# Patient Record
Sex: Female | Born: 1985
Health system: Southern US, Community
[De-identification: ages and names within clinical notes are randomized; demographics above are authoritative.]

## PROBLEM LIST (undated history)

## (undated) DIAGNOSIS — R519 Headache, unspecified: Secondary | ICD-10-CM

## (undated) DIAGNOSIS — R51 Headache: Secondary | ICD-10-CM

## (undated) DIAGNOSIS — T7840XA Allergy, unspecified, initial encounter: Secondary | ICD-10-CM

## (undated) HISTORY — DX: Allergy, unspecified, initial encounter: T78.40XA

## (undated) HISTORY — PX: WISDOM TOOTH EXTRACTION: SHX21

## (undated) HISTORY — DX: Headache: R51

## (undated) HISTORY — DX: Headache, unspecified: R51.9

---

## 2005-11-17 HISTORY — PX: NASAL SINUS SURGERY: SHX719

## 2005-11-17 HISTORY — PX: TONSILLECTOMY AND ADENOIDECTOMY: SUR1326

## 2013-11-10 LAB — HM PAP SMEAR: HM Pap smear: NORMAL

## 2014-07-11 ENCOUNTER — Encounter: Payer: Self-pay | Admitting: Internal Medicine

## 2014-07-11 ENCOUNTER — Ambulatory Visit (INDEPENDENT_AMBULATORY_CARE_PROVIDER_SITE_OTHER): Payer: 59 | Admitting: Internal Medicine

## 2014-07-11 VITALS — BP 102/68 | HR 91 | Resp 12 | Ht 63.0 in | Wt 117.2 lb

## 2014-07-11 DIAGNOSIS — Z0000 Encounter for general adult medical examination without abnormal findings: Secondary | ICD-10-CM

## 2014-07-11 DIAGNOSIS — R590 Localized enlarged lymph nodes: Secondary | ICD-10-CM | POA: Insufficient documentation

## 2014-07-11 DIAGNOSIS — R599 Enlarged lymph nodes, unspecified: Secondary | ICD-10-CM

## 2014-07-11 DIAGNOSIS — L659 Nonscarring hair loss, unspecified: Secondary | ICD-10-CM | POA: Insufficient documentation

## 2014-07-11 DIAGNOSIS — Z23 Encounter for immunization: Secondary | ICD-10-CM

## 2014-07-11 DIAGNOSIS — J320 Chronic maxillary sinusitis: Secondary | ICD-10-CM | POA: Insufficient documentation

## 2014-07-11 DIAGNOSIS — N920 Excessive and frequent menstruation with regular cycle: Secondary | ICD-10-CM | POA: Insufficient documentation

## 2014-07-11 NOTE — Assessment & Plan Note (Signed)
Recent heavy, prolonged menses. Will check CBC and ferritin with labs today. Discussed restarting OCP, however she would like to get pregnant in the near future. Will set up OB/GYN evaluation

## 2014-07-11 NOTE — Assessment & Plan Note (Signed)
Given persistent symptoms and h/o sinus surgery in the past, will set up ENT evaluation for direct visualization and possible CT.

## 2014-07-11 NOTE — Assessment & Plan Note (Signed)
Likely related to recent issues with sinus infection and allergies. Will set up ENT evaluation. If lymphadenopathy persistent, then CT neck may be helpful.

## 2014-07-11 NOTE — Progress Notes (Signed)
Pre visit review using our clinic review tool, if applicable. No additional management support is needed unless otherwise documented below in the visit note. 

## 2014-07-11 NOTE — Patient Instructions (Signed)
Labs today.  Follow up in 4 weeks. 

## 2014-07-11 NOTE — Assessment & Plan Note (Signed)
Recent thinning of hair. Will check CBC, ferritin and TSH with labs.

## 2014-07-11 NOTE — Progress Notes (Signed)
Subjective:    Patient ID: Margaret Schwartz, female    DOB: 1986-10-03, 28 y.o.   MRN: [id number]  HPI 28YO female presents to establish care.  Allergies - left eye with some puffiness on occasion. Sneezing, runny nose. Allergy testing showed multiple outdoor and indoor allergens. Taking Zyrtec with no improvement. Also tried Claritin and Allegra with no improvement. Has occasional sores in nose. Chunks of green tissue with blowing nose. Pressure an pain over cheeks. Notes some enlarged lymph nodes in her neck, particularly on the right.   Notes some recent thinning of hair. This also occurred about 5 months after her pregnancy.  Heavy menses - up to 15 days long at times. Sometimes heavy with clotting. Soaking pads every 1-2 hr. Even in the middle of month, has spotting. On OCP in the past. Question of endometriosis in the past, but never tested.  Review of Systems  Constitutional: Negative for fever, chills, appetite change, fatigue and unexpected weight change.  HENT: Positive for congestion, postnasal drip, rhinorrhea, sinus pressure and sneezing. Negative for sore throat and tinnitus.   Eyes: Negative for visual disturbance.  Respiratory: Negative for shortness of breath.   Cardiovascular: Negative for chest pain and leg swelling.  Gastrointestinal: Negative for nausea, vomiting, abdominal pain, diarrhea and constipation.  Genitourinary: Positive for vaginal bleeding and menstrual problem. Negative for dysuria, frequency, flank pain and vaginal discharge.  Musculoskeletal: Negative for arthralgias and myalgias.  Skin: Negative for color change and rash.  Hematological: Negative for adenopathy. Does not bruise/bleed easily.  Psychiatric/Behavioral: Negative for dysphoric mood. The patient is not nervous/anxious.        Objective:    BP 102/68  Pulse 91  Resp 12  Ht 5\' 3"  (1.6 m)  Wt 117 lb 4 oz (53.184 kg)  BMI 20.78 kg/m2  SpO2 97% Physical Exam  Constitutional: She is  oriented to person, place, and time. She appears well-developed and well-nourished. No distress.  HENT:  Head: Normocephalic and atraumatic.  Right Ear: External ear normal.  Left Ear: External ear normal.  Nose: Nose normal.  Mouth/Throat: Oropharynx is clear and moist. No oropharyngeal exudate.  Eyes: Conjunctivae and EOM are normal. Pupils are equal, round, and reactive to light. Right eye exhibits no discharge.  Neck: Normal range of motion. Neck supple. No thyromegaly present.  Cardiovascular: Normal rate, regular rhythm, normal heart sounds and intact distal pulses.  Exam reveals no gallop and no friction rub.   No murmur heard. Pulmonary/Chest: Effort normal. No respiratory distress. She has no wheezes. She has no rales.  Abdominal: Soft. Bowel sounds are normal. She exhibits no distension and no mass. There is no tenderness. There is no rebound and no guarding.  Musculoskeletal: Normal range of motion. She exhibits no edema and no tenderness.  Lymphadenopathy:    She has cervical adenopathy (few shoddy cervical lymph nodes).       Right: Supraclavicular (small subcentimenter right supraclavicular node) adenopathy present.  Neurological: She is alert and oriented to person, place, and time. No cranial nerve deficit. Coordination normal.  Skin: Skin is warm and dry. No rash noted. She is not diaphoretic. No erythema. No pallor.  Psychiatric: She has a normal mood and affect. Her behavior is normal. Judgment and thought content normal.          Assessment & Plan:   Problem List Items Addressed This Visit     Unprioritized   Chronic maxillary sinusitis - Primary     Given persistent symptoms and  h/o sinus surgery in the past, will set up ENT evaluation for direct visualization and possible CT.     Relevant Medications      cetirizine (ZYRTEC) 10 MG tablet   Other Relevant Orders      Ambulatory referral to ENT      Comprehensive metabolic panel      Lipid panel      Vit D   25 hydroxy (rtn osteoporosis monitoring)   Hair loss     Recent thinning of hair. Will check CBC, ferritin and TSH with labs.    Relevant Orders      TSH   Menorrhagia with regular cycle     Recent heavy, prolonged menses. Will check CBC and ferritin with labs today. Discussed restarting OCP, however she would like to get pregnant in the near future. Will set up OB/GYN evaluation    Relevant Orders      Ambulatory referral to Gynecology      CBC with Differential      Ferritin    Other Visit Diagnoses   Routine general medical examination at a health care facility        Need for prophylactic vaccination and inoculation against influenza        Relevant Orders       Flu Vaccine QUAD 36+ mos PF IM (Fluarix Quad PF) (Completed)        Return in about 4 weeks (around 08/08/2014) for Recheck.

## 2014-07-12 ENCOUNTER — Encounter: Payer: Self-pay | Admitting: Internal Medicine

## 2014-07-12 LAB — COMPREHENSIVE METABOLIC PANEL
ALBUMIN: 4.2 g/dL (ref 3.5–5.2)
ALT: 16 U/L (ref 0–35)
AST: 16 U/L (ref 0–37)
Alkaline Phosphatase: 45 U/L (ref 39–117)
BILIRUBIN TOTAL: 0.4 mg/dL (ref 0.2–1.2)
BUN: 13 mg/dL (ref 6–23)
CO2: 26 mEq/L (ref 19–32)
Calcium: 8.8 mg/dL (ref 8.4–10.5)
Chloride: 104 mEq/L (ref 96–112)
Creatinine, Ser: 0.5 mg/dL (ref 0.4–1.2)
GFR: 140.48 mL/min (ref >60.00)
Glucose, Bld: 72 mg/dL (ref 70–99)
Potassium: 3.8 mEq/L (ref 3.5–5.1)
Sodium: 137 mEq/L (ref 135–145)
Total Protein: 7.3 g/dL (ref 6.0–8.3)

## 2014-07-12 LAB — CBC WITH DIFFERENTIAL/PLATELET
Basophils Absolute: 0 10*3/uL (ref 0.0–0.1)
Basophils Relative: 0.6 % (ref 0.0–3.0)
EOS PCT: 1.1 % (ref 0.0–5.0)
Eosinophils Absolute: 0.1 10*3/uL (ref 0.0–0.7)
HEMATOCRIT: 37.2 % (ref 36.0–46.0)
HEMOGLOBIN: 12.7 g/dL (ref 12.0–15.0)
LYMPHS ABS: 2 10*3/uL (ref 0.7–4.0)
Lymphocytes Relative: 28.6 % (ref 12.0–46.0)
MCHC: 34 g/dL (ref 30.0–36.0)
MCV: 88.4 fl (ref 78.0–100.0)
Monocytes Absolute: 0.4 10*3/uL (ref 0.1–1.0)
Monocytes Relative: 5.1 % (ref 3.0–12.0)
Neutro Abs: 4.4 10*3/uL (ref 1.4–7.7)
Neutrophils Relative %: 64.6 % (ref 43.0–77.0)
PLATELETS: 293 10*3/uL (ref 150.0–400.0)
RBC: 4.21 Mil/uL (ref 3.87–5.11)
RDW: 12 % (ref 11.5–15.5)
WBC: 6.9 10*3/uL (ref 4.0–10.5)

## 2014-07-12 LAB — LIPID PANEL
CHOLESTEROL: 145 mg/dL (ref 0–200)
HDL: 53.2 mg/dL (ref >39.00)
LDL Cholesterol: 80 mg/dL (ref 0–99)
NonHDL: 91.8
Total CHOL/HDL Ratio: 3
Triglycerides: 58 mg/dL (ref 0.0–149.0)
VLDL: 11.6 mg/dL (ref 0.0–40.0)

## 2014-07-12 LAB — FERRITIN: FERRITIN: 15.9 ng/mL (ref 10.0–291.0)

## 2014-07-12 LAB — VITAMIN D 25 HYDROXY (VIT D DEFICIENCY, FRACTURES): VITD: 24.57 ng/mL — AB (ref 30.00–100.00)

## 2014-07-12 LAB — TSH: TSH: 1.15 u[IU]/mL (ref 0.35–4.50)

## 2014-07-14 ENCOUNTER — Encounter: Payer: Self-pay | Admitting: Internal Medicine

## 2014-07-14 MED ORDER — PRENATAL PLUS IRON 29-1 MG PO TABS: 1.0000 | ORAL_TABLET | Freq: Every day | ORAL | Status: DC

## 2014-08-01 ENCOUNTER — Ambulatory Visit: Payer: Self-pay | Admitting: Otolaryngology

## 2014-08-16 LAB — HM PAP SMEAR

## 2014-09-26 ENCOUNTER — Other Ambulatory Visit: Payer: Self-pay | Admitting: Internal Medicine

## 2014-09-26 ENCOUNTER — Encounter: Payer: Self-pay | Admitting: Internal Medicine

## 2014-09-26 DIAGNOSIS — D689 Coagulation defect, unspecified: Secondary | ICD-10-CM

## 2014-09-28 ENCOUNTER — Telehealth: Payer: Self-pay | Admitting: *Deleted

## 2014-09-28 ENCOUNTER — Other Ambulatory Visit: Payer: Self-pay | Admitting: *Deleted

## 2014-09-28 DIAGNOSIS — D689 Coagulation defect, unspecified: Secondary | ICD-10-CM

## 2014-09-28 NOTE — Telephone Encounter (Signed)
Pt coming tomorrow what labs and dx? 

## 2014-09-28 NOTE — Telephone Encounter (Signed)
Homocysteine for coagulopathy

## 2014-09-29 ENCOUNTER — Other Ambulatory Visit (INDEPENDENT_AMBULATORY_CARE_PROVIDER_SITE_OTHER): Payer: 59

## 2014-09-29 DIAGNOSIS — D689 Coagulation defect, unspecified: Secondary | ICD-10-CM

## 2014-09-30 LAB — HOMOCYSTEINE: HOMOCYSTEINE: 4.4 umol/L (ref 4.0–15.4)

## 2014-10-28 ENCOUNTER — Telehealth: Payer: 59 | Admitting: Physician Assistant

## 2014-10-28 DIAGNOSIS — N3000 Acute cystitis without hematuria: Secondary | ICD-10-CM

## 2014-10-28 MED ORDER — FLUCONAZOLE 150 MG PO TABS: 150.0000 mg | ORAL_TABLET | Freq: Once | ORAL | Status: DC

## 2014-10-28 MED ORDER — SULFAMETHOXAZOLE-TRIMETHOPRIM 800-160 MG PO TABS: 1.0000 | ORAL_TABLET | Freq: Two times a day (BID) | ORAL | Status: DC

## 2014-10-28 NOTE — Progress Notes (Signed)
We are sorry that you are not feeling well.  Here is how we plan to help!  Based on what you shared with me it looks like you most likely have a simple urinary tract infection.  A UTI (Urinary Tract Infection) is a bacterial infection of the bladder.  Most cases of urinary tract infections are simple to treat but a key part of your care is to encourage you to drink plenty of fluids and watch your symptoms carefully.  I have prescribed Bactrim, a sulfa antibiotic, twice a day for 5 days.  Your symptoms should gradually improve. Call us if the burning in your urine worsens, you develop worsening fever, back pain or pelvic pain or if your symptoms do not resolve after completing the antibiotic.  Urinary tract infections can be prevented by drinking plenty of water to keep your body hydrated.  Also be sure when you wipe, wipe from front to back and don't hold it in!  If possible, empty your bladder every 4 hours.  Your e-visit answers were reviewed by a board certified advanced clinical practitioner to complete your personal care plan.  Depending on the condition, your plan could have included both over the counter or prescription medications.  Please review your pharmacy choice.  If there is a problem, you may call our nursing hot line at [phone] and have the prescription routed to another pharmacy.  Your safety is important to us.  If you have drug allergies check your prescription carefully.    You can use MyChart to ask questions about today's visit, request a non-urgent call back, or ask for a work or school excuse.  You will get an e-mail in the next two days asking about your experience.  I hope that your e-visit has been valuable and will speed your recovery. Thank you for using e-visits.    

## 2015-02-11 DIAGNOSIS — E7210 Disorders of sulfur-bearing amino-acid metabolism, unspecified: Secondary | ICD-10-CM | POA: Insufficient documentation

## 2015-07-13 ENCOUNTER — Encounter: Payer: Self-pay | Admitting: Internal Medicine

## 2015-07-15 ENCOUNTER — Telehealth: Payer: 59 | Admitting: Family

## 2015-07-15 DIAGNOSIS — J00 Acute nasopharyngitis [common cold]: Secondary | ICD-10-CM

## 2015-07-15 DIAGNOSIS — B9689 Other specified bacterial agents as the cause of diseases classified elsewhere: Secondary | ICD-10-CM

## 2015-07-15 DIAGNOSIS — J208 Acute bronchitis due to other specified organisms: Secondary | ICD-10-CM

## 2015-07-15 DIAGNOSIS — R05 Cough: Secondary | ICD-10-CM

## 2015-07-15 DIAGNOSIS — R059 Cough, unspecified: Secondary | ICD-10-CM

## 2015-07-15 MED ORDER — AZITHROMYCIN 250 MG PO TABS: ORAL_TABLET | ORAL | Status: DC

## 2015-07-15 MED ORDER — BENZONATATE 100 MG PO CAPS: 100.0000 mg | ORAL_CAPSULE | Freq: Three times a day (TID) | ORAL | Status: DC | PRN

## 2015-07-15 NOTE — Progress Notes (Signed)

## 2015-07-17 ENCOUNTER — Ambulatory Visit (INDEPENDENT_AMBULATORY_CARE_PROVIDER_SITE_OTHER)
Admission: RE | Admit: 2015-07-17 | Discharge: 2015-07-17 | Disposition: A | Discharge status: Home / Self Care | Payer: Self-pay | Source: Ambulatory Visit | Attending: Internal Medicine | Admitting: Internal Medicine

## 2015-07-17 ENCOUNTER — Ambulatory Visit (INDEPENDENT_AMBULATORY_CARE_PROVIDER_SITE_OTHER): Payer: 59 | Admitting: Internal Medicine

## 2015-07-17 ENCOUNTER — Encounter: Payer: Self-pay | Admitting: Internal Medicine

## 2015-07-17 VITALS — BP 114/76 | HR 86 | Temp 97.9°F | Ht 62.5 in | Wt 115.5 lb

## 2015-07-17 DIAGNOSIS — Z0000 Encounter for general adult medical examination without abnormal findings: Secondary | ICD-10-CM | POA: Insufficient documentation

## 2015-07-17 DIAGNOSIS — R059 Cough, unspecified: Secondary | ICD-10-CM

## 2015-07-17 DIAGNOSIS — R05 Cough: Secondary | ICD-10-CM | POA: Diagnosis not present

## 2015-07-17 NOTE — Assessment & Plan Note (Signed)
General medical exam normal today except as noted. She will get Flu vaccine at work. Fasting labs ordered. PAP is UTD and she has follow up with her OB. Encouraged healthy diet and exercise.

## 2015-07-17 NOTE — Progress Notes (Signed)
Subjective:    Patient ID: Margaret Schwartz, female    DOB: 1986-09-28, 29 y.o.   MRN: [id number]  HPI  29YO female presents for annual exam.  Over the last couple of weeks, she has developed congestion and cough. Her daughter had been sick with similar symptoms. Over last few days, she has developed right lower chest wall pain with crackles and "whistling" sound on inspiration. No recurrent fever or chills. No dyspnea. Seen in an E-visit and treated with azithromycin. No changes noted in symptoms. Continues to have mild congestion, non-productive cough and fatigue.  Aside from this, she is feeling well. PAP scheduled with OB this fall.   Past Medical History  Diagnosis Date  . Allergy   . Frequent headaches    Family History  Problem Relation Age of Onset  . Heart disease Mother   . Hyperlipidemia Mother   . Hypertension Mother   . Alcohol abuse Father   . Drug abuse Father   . Arthritis Maternal Grandmother   . Diabetes Maternal Grandmother   . Arthritis Maternal Grandfather   . Cancer Maternal Grandfather     Lung cancer  . Heart disease Maternal Grandfather   . Stroke Maternal Grandfather   . Hypertension Maternal Grandfather   . Cancer Maternal Uncle 48    NHL   Past Surgical History  Procedure Laterality Date  . Tonsillectomy and adenoidectomy  2007  . Nasal sinus surgery  2007    turbinate reduction  . Wisdom tooth extraction    . Vaginal delivery  2013    friable cervix with bleeding during pregnancy, anemia after pregnancy   Social History   Social History  . Marital Status: Married    Spouse Name: N/A  . Number of Children: N/A  . Years of Education: N/A   Social History Main Topics  . Smoking status: Never Smoker   . Smokeless tobacco: Never Used  . Alcohol Use: No  . Drug Use: No  . Sexual Activity: Not Asked   Other Topics Concern  . None   Social History Narrative   Lives in [place] with husband and daughter.      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 Negative for fever, chills, appetite change and unexpected weight change.  HENT: Positive for congestion, postnasal drip and rhinorrhea. Negative for sore throat, trouble swallowing and voice change.   Eyes: Negative for visual disturbance.  Respiratory: Positive for cough and wheezing. Negative for chest tightness and shortness of breath.   Cardiovascular: Negative for chest pain and leg swelling.  Gastrointestinal: Negative for nausea, vomiting, abdominal pain, diarrhea and constipation.  Musculoskeletal: Negative for myalgias and arthralgias.  Skin: Negative for color change and rash.  Hematological: Negative for adenopathy. Does not bruise/bleed easily.  Psychiatric/Behavioral: Negative for sleep disturbance and dysphoric mood. The patient is not nervous/anxious.        Objective:    BP 114/76 mmHg  Pulse 86  Temp(Src) 97.9 F (36.6 C) (Oral)  Ht 5' 2.5" (1.588 m)  Wt 115 lb 8 oz (52.39 kg)  BMI 20.78 kg/m2  SpO2 99%  LMP 06/27/2015 (Approximate) Physical Exam  Constitutional: She is oriented to person, place, and time. She appears well-developed and well-nourished. No distress.  HENT:  Head: Normocephalic and atraumatic.  Right Ear: External ear normal.  Left Ear:  External ear normal.  Nose: Nose normal.  Mouth/Throat: Oropharynx is clear and moist. No oropharyngeal exudate.  Eyes: Conjunctivae and EOM are normal. Pupils are equal, round, and reactive to light. Right eye exhibits no discharge. Left eye exhibits no discharge. No scleral icterus.  Neck: Normal range of motion. Neck supple. No tracheal deviation present. No thyromegaly present.  Cardiovascular: Normal rate, regular rhythm, normal heart sounds and intact distal pulses.  Exam reveals no gallop and no friction rub.   No murmur heard. Pulmonary/Chest: Effort normal. No accessory muscle usage.  No respiratory distress. She has no decreased breath sounds. She has no wheezes. She has rhonchi (few rhonchi right lower lung) in the right lower field. She has no rales. She exhibits no tenderness.  Abdominal: Soft. Bowel sounds are normal. She exhibits no distension and no mass. There is no tenderness. There is no rebound and no guarding.  Musculoskeletal: Normal range of motion. She exhibits no edema or tenderness.  Lymphadenopathy:    She has no cervical adenopathy.  Neurological: She is alert and oriented to person, place, and time. No cranial nerve deficit. She exhibits normal muscle tone. Coordination normal.  Skin: Skin is warm and dry. No rash noted. She is not diaphoretic. No erythema. No pallor.  Psychiatric: She has a normal mood and affect. Her behavior is normal. Judgment and thought content normal.          Assessment & Plan:   Problem List Items Addressed This Visit      Unprioritized   Cough    Recent cough likely secondary to viral URI, however exam remarkable for RLL crackles. Will get CXR for evaluation. Continue Azithromycin. Follow up if symptoms are not improving.      Relevant Orders   DG Chest 2 View   Routine general medical examination at a health care facility - Primary    General medical exam normal today except as noted. She will get Flu vaccine at work. Fasting labs ordered. PAP is UTD and she has follow up with her OB. Encouraged healthy diet and exercise.      Relevant Orders   TSH   CBC with Differential/Platelet   Comprehensive metabolic panel   Lipid panel   Vit D  25 hydroxy (rtn osteoporosis monitoring)       Return if symptoms worsen or fail to improve.

## 2015-07-17 NOTE — Progress Notes (Signed)
Pre visit review using our clinic review tool, if applicable. No additional management support is needed unless otherwise documented below in the visit note. 

## 2015-07-17 NOTE — Patient Instructions (Signed)
Chest xray today.  Health Maintenance Adopting a healthy lifestyle and getting preventive care can go a long way to promote health and wellness. Talk with your health care provider about what schedule of regular examinations is right for you. This is a good chance for you to check in with your provider about disease prevention and staying healthy. In between checkups, there are plenty of things you can do on your own. Experts have done a lot of research about which lifestyle changes and preventive measures are most likely to keep you healthy. Ask your health care provider for more information. WEIGHT AND DIET  Eat a healthy diet  Be sure to include plenty of vegetables, fruits, low-fat dairy products, and lean protein.  Do not eat a lot of foods high in solid fats, added sugars, or salt.  Get regular exercise. This is one of the most important things you can do for your health.  Most adults should exercise for at least 150 minutes each week. The exercise should increase your heart rate and make you sweat (moderate-intensity exercise).  Most adults should also do strengthening exercises at least twice a week. This is in addition to the moderate-intensity exercise.  Maintain a healthy weight  Body mass index (BMI) is a measurement that can be used to identify possible weight problems. It estimates body fat based on height and weight. Your health care provider can help determine your BMI and help you achieve or maintain a healthy weight.  For females 52 years of age and older:   A BMI below 18.5 is considered underweight.  A BMI of 18.5 to 24.9 is normal.  A BMI of 25 to 29.9 is considered overweight.  A BMI of 30 and above is considered obese.  Watch levels of cholesterol and blood lipids  You should start having your blood tested for lipids and cholesterol at 31 years of age, then have this test every 5 years.  You may need to have your cholesterol levels checked more often  if:  Your lipid or cholesterol levels are high.  You are older than 31 years of age.  You are at high risk for heart disease.  CANCER SCREENING   Lung Cancer  Lung cancer screening is recommended for adults 66-19 years old who are at high risk for lung cancer because of a history of smoking.  A yearly low-dose CT scan of the lungs is recommended for people who:  Currently smoke.  Have quit within the past 15 years.  Have at least a 30-pack-year history of smoking. A pack year is smoking an average of one pack of cigarettes a day for 1 year.  Yearly screening should continue until it has been 15 years since you quit.  Yearly screening should stop if you develop a health problem that would prevent you from having lung cancer treatment.  Breast Cancer  Practice breast self-awareness. This means understanding how your breasts normally appear and feel.  It also means doing regular breast self-exams. Let your health care provider know about any changes, no matter how small.  If you are in your 20s or 30s, you should have a clinical breast exam (CBE) by a health care provider every 1-3 years as part of a regular health exam.  If you are 52 or older, have a CBE every year. Also consider having a breast X-ray (mammogram) every year.  If you have a family history of breast cancer, talk to your health care provider about genetic screening.  If you are at high risk for breast cancer, talk to your health care provider about having an MRI and a mammogram every year.  Breast cancer gene (BRCA) assessment is recommended for women who have family members with BRCA-related cancers. BRCA-related cancers include:  Breast.  Ovarian.  Tubal.  Peritoneal cancers.  Results of the assessment will determine the need for genetic counseling and BRCA1 and BRCA2 testing. Cervical Cancer Routine pelvic examinations to screen for cervical cancer are no longer recommended for nonpregnant women  who are considered low risk for cancer of the pelvic organs (ovaries, uterus, and vagina) and who do not have symptoms. A pelvic examination may be necessary if you have symptoms including those associated with pelvic infections. Ask your health care provider if a screening pelvic exam is right for you.   The Pap test is the screening test for cervical cancer for women who are considered at risk.  If you had a hysterectomy for a problem that was not cancer or a condition that could lead to cancer, then you no longer need Pap tests.  If you are older than 65 years, and you have had normal Pap tests for the past 10 years, you no longer need to have Pap tests.  If you have had past treatment for cervical cancer or a condition that could lead to cancer, you need Pap tests and screening for cancer for at least 20 years after your treatment.  If you no longer get a Pap test, assess your risk factors if they change (such as having a new sexual partner). This can affect whether you should start being screened again.  Some women have medical problems that increase their chance of getting cervical cancer. If this is the case for you, your health care provider may recommend more frequent screening and Pap tests.  The human papillomavirus (HPV) test is another test that may be used for cervical cancer screening. The HPV test looks for the virus that can cause cell changes in the cervix. The cells collected during the Pap test can be tested for HPV.  The HPV test can be used to screen women 44 years of age and older. Getting tested for HPV can extend the interval between normal Pap tests from three to five years.  An HPV test also should be used to screen women of any age who have unclear Pap test results.  After 31 years of age, women should have HPV testing as often as Pap tests.  Colorectal Cancer  This type of cancer can be detected and often prevented.  Routine colorectal cancer screening usually  begins at 31 years of age and continues through 31 years of age.  Your health care provider may recommend screening at an earlier age if you have risk factors for colon cancer.  Your health care provider may also recommend using home test kits to check for hidden blood in the stool.  A small camera at the end of a tube can be used to examine your colon directly (sigmoidoscopy or colonoscopy). This is done to check for the earliest forms of colorectal cancer.  Routine screening usually begins at age 53.  Direct examination of the colon should be repeated every 5-10 years through 31 years of age. However, you may need to be screened more often if early forms of precancerous polyps or small growths are found. Skin Cancer  Check your skin from head to toe regularly.  Tell your health care provider about any new moles  or changes in moles, especially if there is a change in a mole's shape or color.  Also tell your health care provider if you have a mole that is larger than the size of a pencil eraser.  Always use sunscreen. Apply sunscreen liberally and repeatedly throughout the day.  Protect yourself by wearing long sleeves, pants, a wide-brimmed hat, and sunglasses whenever you are outside. HEART DISEASE, DIABETES, AND HIGH BLOOD PRESSURE   Have your blood pressure checked at least every 1-2 years. High blood pressure causes heart disease and increases the risk of stroke.  If you are between 34 years and 71 years old, ask your health care provider if you should take aspirin to prevent strokes.  Have regular diabetes screenings. This involves taking a blood sample to check your fasting blood sugar level.  If you are at a normal weight and have a low risk for diabetes, have this test once every three years after 31 years of age.  If you are overweight and have a high risk for diabetes, consider being tested at a younger age or more often. PREVENTING INFECTION  Hepatitis B  If you have a  higher risk for hepatitis B, you should be screened for this virus. You are considered at high risk for hepatitis B if:  You were born in a country where hepatitis B is common. Ask your health care provider which countries are considered high risk.  Your parents were born in a high-risk country, and you have not been immunized against hepatitis B (hepatitis B vaccine).  You have HIV or AIDS.  You use needles to inject street drugs.  You live with someone who has hepatitis B.  You have had sex with someone who has hepatitis B.  You get hemodialysis treatment.  You take certain medicines for conditions, including cancer, organ transplantation, and autoimmune conditions. Hepatitis C  Blood testing is recommended for:  Everyone born from [birth year] through 1967.  Anyone with known risk factors for hepatitis C. Sexually transmitted infections (STIs)  You should be screened for sexually transmitted infections (STIs) including gonorrhea and chlamydia if:  You are sexually active and are younger than 31 years of age.  You are older than 31 years of age and your health care provider tells you that you are at risk for this type of infection.  Your sexual activity has changed since you were last screened and you are at an increased risk for chlamydia or gonorrhea. Ask your health care provider if you are at risk.  If you do not have HIV, but are at risk, it may be recommended that you take a prescription medicine daily to prevent HIV infection. This is called pre-exposure prophylaxis (PrEP). You are considered at risk if:  You are sexually active and do not regularly use condoms or know the HIV status of your partner(s).  You take drugs by injection.  You are sexually active with a partner who has HIV. Talk with your health care provider about whether you are at high risk of being infected with HIV. If you choose to begin PrEP, you should first be tested for HIV. You should then be tested  every 3 months for as long as you are taking PrEP.  PREGNANCY   If you are premenopausal and you may become pregnant, ask your health care provider about preconception counseling.  If you may become pregnant, take 400 to 800 micrograms (mcg) of folic acid every day.  If you want to prevent pregnancy,  talk to your health care provider about birth control (contraception). OSTEOPOROSIS AND MENOPAUSE   Osteoporosis is a disease in which the bones lose minerals and strength with aging. This can result in serious bone fractures. Your risk for osteoporosis can be identified using a bone density scan.  If you are 77 years of age or older, or if you are at risk for osteoporosis and fractures, ask your health care provider if you should be screened.  Ask your health care provider whether you should take a calcium or vitamin D supplement to lower your risk for osteoporosis.  Menopause may have certain physical symptoms and risks.  Hormone replacement therapy may reduce some of these symptoms and risks. Talk to your health care provider about whether hormone replacement therapy is right for you.  HOME CARE INSTRUCTIONS   Schedule regular health, dental, and eye exams.  Stay current with your immunizations.   Do not use any tobacco products including cigarettes, chewing tobacco, or electronic cigarettes.  If you are pregnant, do not drink alcohol.  If you are breastfeeding, limit how much and how often you drink alcohol.  Limit alcohol intake to no more than 1 drink per day for nonpregnant women. One drink equals 12 ounces of beer, 5 ounces of wine, or 1 ounces of hard liquor.  Do not use street drugs.  Do not share needles.  Ask your health care provider for help if you need support or information about quitting drugs.  Tell your health care provider if you often feel depressed.  Tell your health care provider if you have ever been abused or do not feel safe at home. Document  Released: 05/19/2011 Document Revised: 03/20/2014 Document Reviewed: 10/05/2013 [facility] Patient Information 2015 [place], [place]. This information is not intended to replace advice given to you by your health care provider. Make sure you discuss any questions you have with your health care provider.

## 2015-07-17 NOTE — Assessment & Plan Note (Signed)
Recent cough likely secondary to viral URI, however exam remarkable for RLL crackles. Will get CXR for evaluation. Continue Azithromycin. Follow up if symptoms are not improving.

## 2015-09-11 ENCOUNTER — Ambulatory Visit: Payer: Self-pay

## 2015-10-09 ENCOUNTER — Other Ambulatory Visit: Payer: Self-pay | Admitting: Internal Medicine

## 2015-11-13 ENCOUNTER — Encounter: Payer: Self-pay | Admitting: Nurse Practitioner

## 2015-11-13 ENCOUNTER — Ambulatory Visit (INDEPENDENT_AMBULATORY_CARE_PROVIDER_SITE_OTHER): Payer: 59 | Admitting: Nurse Practitioner

## 2015-11-13 VITALS — BP 104/72 | HR 96 | Temp 98.1°F | Resp 12 | Ht 62.5 in | Wt 122.2 lb

## 2015-11-13 DIAGNOSIS — J029 Acute pharyngitis, unspecified: Secondary | ICD-10-CM

## 2015-11-13 LAB — POCT RAPID STREP A (OFFICE): Rapid Strep A Screen: NEGATIVE

## 2015-11-13 MED ORDER — METHYLPREDNISOLONE 4 MG PO TABS: ORAL_TABLET | ORAL | Status: DC

## 2015-11-13 NOTE — Patient Instructions (Signed)
Mucinex Over the counter if you get congested   Prednisone with breakfast or lunch at the latest.  6 tablets on day 1, 5 tablets on day 2, 4 tablets on day 3, 3 tablets on day 4, 2 tablets day 5, 1 tablet on day 6...done! Take tablets all together not spaced out Don't take with NSAIDs (Ibuprofen, Aleve, Naproxen, Meloxicam ect...)

## 2015-11-13 NOTE — Progress Notes (Signed)
Patient ID: Margaret Schwartz, female    DOB: [birth year]  Age: 29 y.o. MRN: [id number]  CC: Sore Throat and Otalgia   HPI Margaret Schwartz presents for CC of sore throat and otalgia bilaterally x 4 days.  1) Patient reports a sore throat that is painful with swallowing and bilateral ear pain 4 days. Sick contacts include her daughter who is 83 yo. Treatment to date has been OTC measures and taking zyrtec. Patient denies any other symptoms that are contributing to this.  History Margaret Schwartz has a past medical history of Allergy and Frequent headaches.   She has past surgical history that includes Tonsillectomy and adenoidectomy (2007); Nasal sinus surgery (2007); Wisdom tooth extraction; and Vaginal delivery (2013).   Her family history includes Alcohol abuse in her father; Arthritis in her maternal grandfather and maternal grandmother; Cancer in her maternal grandfather; Cancer (age of onset: 66) in her maternal uncle; Diabetes in her maternal grandmother; Drug abuse in her father; Heart disease in her maternal grandfather and mother; Hyperlipidemia in her mother; Hypertension in her maternal grandfather and mother; Stroke in her maternal grandfather.She reports that she has never smoked. She has never used smokeless tobacco. She reports that she does not drink alcohol or use illicit drugs.  Outpatient Prescriptions Prior to Visit  Medication Sig Dispense Refill  . PRENATAL 27-1 MG TABS TAKE 1 TABLET BY MOUTH DAILY. 90 tablet 3  . cetirizine (ZYRTEC) 10 MG tablet Take 10 mg by mouth daily as needed for allergies. Reported on 11/13/2015    . azithromycin (ZITHROMAX) 250 MG tablet Take 2 tablets now then 1 daily times 4 days (Patient not taking: Reported on 11/13/2015) 6 tablet 0   No facility-administered medications prior to visit.    ROS Review of Systems  Constitutional: Negative for fever, chills, diaphoresis and fatigue.  HENT: Positive for ear pain and sore throat. Negative for congestion, ear  discharge, postnasal drip, rhinorrhea, sinus pressure, sneezing, trouble swallowing and voice change.   Eyes: Negative for visual disturbance.  Respiratory: Negative for cough, chest tightness and wheezing.    Objective:  BP 104/72 mmHg  Pulse 96  Temp(Src) 98.1 F (36.7 C) (Oral)  Resp 12  Ht 5' 2.5" (1.588 m)  Wt 122 lb 4 oz (55.452 kg)  BMI 21.99 kg/m2  SpO2 100%  LMP 11/13/2015  Physical Exam  Constitutional: She is oriented to person, place, and time. She appears well-developed and well-nourished. No distress.  HENT:  Head: Normocephalic and atraumatic.  Right Ear: External ear normal.  Left Ear: External ear normal.  Mouth/Throat: No oropharyngeal exudate.  Oropharynx is erythematous TMs clear bilaterally  Eyes: EOM are normal. Pupils are equal, round, and reactive to light. Right eye exhibits no discharge. Left eye exhibits no discharge. No scleral icterus.  Neck: Normal range of motion. Neck supple.  Cardiovascular: Normal rate, regular rhythm and normal heart sounds.  Exam reveals no gallop and no friction rub.   No murmur heard. Pulmonary/Chest: Effort normal and breath sounds normal. No respiratory distress. She has no wheezes. She has no rales. She exhibits no tenderness.  Lymphadenopathy:    She has no cervical adenopathy.  Neurological: She is alert and oriented to person, place, and time. No cranial nerve deficit. She exhibits normal muscle tone. Coordination normal.  Skin: Skin is warm and dry. No rash noted. She is not diaphoretic.  Psychiatric: She has a normal mood and affect. Her behavior is normal. Judgment and thought content normal.   Assessment & Plan:  Latiqua was seen today for sore throat and otalgia.  Diagnoses and all orders for this visit:  Sorethroat -     POCT rapid strep A  Other orders -     Discontinue: methylPREDNISolone (MEDROL) 4 MG tablet; Take 6 tablets by mouth with breakfast or lunch and decrease by 1 tablet each day until  gone. -     methylPREDNISolone (MEDROL) 4 MG tablet; Take 6 tablets by mouth with breakfast or lunch and decrease by 1 tablet each day until gone.   I have discontinued Ms. Devine's azithromycin. I am also having her maintain her cetirizine, PRENATAL, and methylPREDNISolone.  Meds ordered this encounter  Medications  . DISCONTD: methylPREDNISolone (MEDROL) 4 MG tablet    Sig: Take 6 tablets by mouth with breakfast or lunch and decrease by 1 tablet each day until gone.    Dispense:  21 tablet    Refill:  0    Order Specific Question:  Supervising Provider    Answer:  Deborra Medina L [[id number]]  . methylPREDNISolone (MEDROL) 4 MG tablet    Sig: Take 6 tablets by mouth with breakfast or lunch and decrease by 1 tablet each day until gone.    Dispense:  21 tablet    Refill:  0    Order Specific Question:  Supervising Provider    Answer:  Crecencio Mc [[id number]]     Follow-up: Return if symptoms worsen or fail to improve.

## 2015-11-13 NOTE — Progress Notes (Signed)
Pre-visit discussion using our clinic review tool. No additional management support is needed unless otherwise documented below in the visit note.  

## 2015-11-14 ENCOUNTER — Encounter: Payer: Self-pay | Admitting: Nurse Practitioner

## 2015-11-14 ENCOUNTER — Other Ambulatory Visit: Payer: Self-pay | Admitting: Nurse Practitioner

## 2015-11-14 MED ORDER — AMOXICILLIN 500 MG PO CAPS: 500.0000 mg | ORAL_CAPSULE | Freq: Two times a day (BID) | ORAL | Status: DC

## 2015-11-15 DIAGNOSIS — J029 Acute pharyngitis, unspecified: Secondary | ICD-10-CM | POA: Insufficient documentation

## 2015-11-15 NOTE — Assessment & Plan Note (Signed)
POCT rapid strep is negative. Pt was given medrol dosepak with verbal and written instructions to pharmacy. After a note by the pt stating no improvement I suggested Sudafed and sent amoxicillin if continued worsening over the holiday. FU prn worsening/failure to improve.

## 2015-12-18 ENCOUNTER — Encounter: Payer: Self-pay | Admitting: Family Medicine

## 2015-12-18 ENCOUNTER — Ambulatory Visit (INDEPENDENT_AMBULATORY_CARE_PROVIDER_SITE_OTHER): Payer: 59 | Admitting: Family Medicine

## 2015-12-18 VITALS — BP 128/87 | HR 111 | Ht 62.0 in | Wt 122.0 lb

## 2015-12-18 DIAGNOSIS — Z3169 Encounter for other general counseling and advice on procreation: Secondary | ICD-10-CM | POA: Diagnosis not present

## 2015-12-18 DIAGNOSIS — Z1151 Encounter for screening for human papillomavirus (HPV): Secondary | ICD-10-CM

## 2015-12-18 DIAGNOSIS — Z01419 Encounter for gynecological examination (general) (routine) without abnormal findings: Secondary | ICD-10-CM | POA: Diagnosis not present

## 2015-12-18 DIAGNOSIS — N920 Excessive and frequent menstruation with regular cycle: Secondary | ICD-10-CM

## 2015-12-18 DIAGNOSIS — Z124 Encounter for screening for malignant neoplasm of cervix: Secondary | ICD-10-CM | POA: Diagnosis not present

## 2015-12-18 NOTE — Assessment & Plan Note (Signed)
No evidence of VWB--normal [place]. ? Need for D and C - someone has told her this previously. Discussed no evidence that this would work. May need to remain on contraception with cycle control when not trying to conceive.

## 2015-12-18 NOTE — Progress Notes (Signed)
Subjective:     Margaret Schwartz is a 30 y.o. female and is here for a comprehensive physical exam. The patient reports problems - heavy menses. Has had this for many years and on OC's from age [age]. Got pregnant right away the first time.  Trying to conceive x 5 months. Pretty sure she knows when she is ovulating and is having intercourse at the appropriate time. Last cycle heavy with clots which is new for her. Typically has cycles that are 28-30 days apart, but have been as close as q 19 days. Cycles last 10 days with several days of spotting on either side. Has had an elaborate work-up for VWB and clotting with Dr. Garwin Brothers, and Hematology at [facility]. 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 Had normal pelvic sonogram and TSH. Post delivery had prolonged bleeding (eventually treated for presumed endometritis) and bled throughout her last pregnancy. Reports painful cycles, painful ovulation and painful intercourse.  All pain is cramping in nature. No prior laparoscopy.  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  Social History   Social History  . Marital Status: Married    Spouse Name: N/A  . Number of Children: N/A  . Years of Education: N/A   Occupational History  . Not on file.   Social History Main Topics  . Smoking status: Never Smoker   . Smokeless tobacco: Never Used  . Alcohol Use: No  . Drug Use: No  . Sexual Activity: Not on file   Other Topics Concern  . Not on file   Social History Narrative   Lives in [place] with husband and daughter.      Work - Marine scientist at [employer] - regular       Exercise - walking   Health Maintenance  Topic Date Due  . HIV Screening  02/05/1999  . INFLUENZA VACCINE  06/18/2015  . PAP SMEAR  08/16/2017  . TETANUS/TDAP  07/16/2021    The following portions of the patient's history were reviewed and updated as appropriate: allergies, current medications, past family  history, past medical history, past social history, past surgical history and problem list.  Review of Systems Pertinent items noted in HPI and remainder of comprehensive ROS otherwise negative.   Objective:    BP 128/87 mmHg  Pulse 111  Ht 5\' 2"  (1.575 m)  Wt 122 lb (55.339 kg)  BMI 22.31 kg/m2  LMP 12/09/2015 General appearance: alert, cooperative and appears stated age Head: Normocephalic, without obvious abnormality, atraumatic Neck: no adenopathy, supple, symmetrical, trachea midline and thyroid not enlarged, symmetric, no tenderness/mass/nodules Lungs: clear to auscultation bilaterally Breasts: normal appearance, no masses or tenderness Heart: regular rate and rhythm, S1, S2 normal, no murmur, click, rub or gallop Abdomen: soft, non-tender; bowel sounds normal; no masses,  no organomegaly Pelvic: cervix normal in appearance, external genitalia normal, no adnexal masses or tenderness, no cervical motion tenderness, uterus normal size, shape, and consistency and vagina normal without discharge Extremities: extremities normal, atraumatic, no cyanosis or edema Pulses: 2+ and symmetric Skin: Skin color, texture, turgor normal. No rashes or lesions Lymph nodes: Cervical, supraclavicular, and axillary nodes normal. Neurologic: Grossly normal    Assessment:    Healthy female exam.      Plan:   Problem List Items Addressed This Visit      Unprioritized   Menorrhagia with regular cycle    No evidence of VWB--normal [place]. ?  Need for D and C - someone has told her this previously. Discussed no evidence that this would work. May need to remain on contraception with cycle control when not trying to conceive.       Other Visit Diagnoses    Encounter for routine gynecological examination    -  Primary    Screening for malignant neoplasm of cervix        Relevant Orders    Cytology - PAP    Pre-conception counseling        advised timing of intercourse, usual fecundity for normal  couples, next steps-if no conception by 9 months, consider trial of clomid/femhrt, then REI referral         See After Visit Summary for Counseling Recommendations

## 2015-12-18 NOTE — Patient Instructions (Signed)
Preparing for Pregnancy Before trying to become pregnant, make an appointment with your health care provider (preconception care). The goal is to help you have a healthy, safe pregnancy. At your first appointment, your health care provider will:   Do a complete physical exam, including a Pap test.  Take a complete medical history.  Give you advice and help you resolve any problems. PRECONCEPTION CHECKLIST Here is a list of the basics to cover with your health care provider at your preconception visit:  Medical history.  Tell your health care provider about any diseases you have had. Many diseases can affect your pregnancy.  Include your partner's medical history and family history.  Make sure you have been tested for sexually transmitted infections (STIs). These can affect your pregnancy. In some cases, they can be passed to your baby. Tell your health care provider about any history of STIs.  Make sure your health care provider knows about any previous problems you have had with conception or pregnancy.  Tell your health care provider about any medicine you take. This includes herbal supplements and over-the-counter medicines.  Make sure all your immunizations are up to date. You may need to make additional appointments.  Ask your health care provider if you need any vaccinations or if there are any you should avoid.  Diet.  It is especially important to eat a healthy, balanced diet with the right nutrients when you are pregnant.  Ask your health care provider to help you get to a healthy weight before pregnancy.  If you are overweight, you are at higher risk for certain complications. These include high blood pressure, diabetes, and preterm birth.  If you are underweight, you are more likely to have a low-birth-weight baby.  Lifestyle.  Tell your health care provider about lifestyle factors such as alcohol use, drug use, or smoking.  Describe any harmful substances you may  be exposed to at work or home. These can include chemicals, pesticides, and radiation.  Mental health.  Let your health care provider know if you have been feeling depressed or anxious.  Let your health care provider know if you have a history of substance abuse.  Let your health care provider know if you do not feel safe at home. HOME INSTRUCTIONS TO PREPARE FOR PREGNANCY Follow your health care provider's advice and instructions.   Keep an accurate record of your menstrual periods. This makes it easier for your health care provider to determine your baby's due date.  Begin taking prenatal vitamins and folic acid supplements daily. Take them as directed by your health care provider.  Eat a balanced diet. Get help from a nutrition counselor if you have questions or need help.  Get regular exercise. Try to be active for at least 30 minutes a day most days of the week.  Quit smoking, if you smoke.  Do not drink alcohol.  Do not take illegal drugs.  Get medical problems, such as diabetes or high blood pressure, under control.  If you have diabetes, make sure you do the following:  Have good blood sugar control. If you have type 1 diabetes, use multiple daily doses of insulin. Do not use split-dose or premixed insulin.  Have an eye exam by a qualified eye care professional trained in caring for people with diabetes.  Get evaluated by your health care provider for cardiovascular disease.  Get to a healthy weight. If you are overweight or obese, reduce your weight with the help of a qualified health   professional such as a registered dietitian. Ask your health care provider what the right weight range is for you. HOW DO I KNOW I AM PREGNANT? You may be pregnant if you have been sexually active and you miss your period. Symptoms of early pregnancy include:   Mild cramping.  Very light vaginal bleeding (spotting).  Feeling unusually tired.  Morning sickness. If you have any of  these symptoms, take a home pregnancy test. These tests look for a hormone called human chorionic gonadotropin (hCG) in your urine. Your body begins to make this hormone during early pregnancy. These tests are very accurate. Wait until at least the first day you miss your period to take one. If you get a positive result, call your health care provider to make appointments for prenatal care. WHAT SHOULD I DO IF I BECOME PREGNANT?  Make an appointment with your health care provider by week 12 of your pregnancy at the latest.  Do not smoke. Smoking can be harmful to your baby.  Do not drink alcoholic beverages. Alcohol is related to a number of birth defects.  Avoid toxic odors and chemicals.  You may continue to have sexual intercourse if it does not cause pain or other problems, such as vaginal bleeding.   This information is not intended to replace advice given to you by your health care provider. Make sure you discuss any questions you have with your health care provider.   Document Released: 10/16/2008 Document Revised: 11/24/2014 Document Reviewed: 10/10/2013 Elsevier Interactive Patient Education 2016 [employer].  

## 2015-12-19 ENCOUNTER — Encounter: Payer: Self-pay | Admitting: *Deleted

## 2015-12-20 LAB — CYTOLOGY - PAP

## 2016-01-15 ENCOUNTER — Ambulatory Visit
Admission: RE | Admit: 2016-01-15 | Discharge: 2016-01-15 | Disposition: A | Discharge status: Home / Self Care | Payer: 59 | Source: Ambulatory Visit | Attending: Obstetrics & Gynecology | Admitting: Obstetrics & Gynecology

## 2016-01-15 ENCOUNTER — Telehealth: Payer: Self-pay | Admitting: *Deleted

## 2016-01-15 ENCOUNTER — Telehealth: Payer: Self-pay | Admitting: Obstetrics & Gynecology

## 2016-01-15 DIAGNOSIS — Z3A00 Weeks of gestation of pregnancy not specified: Secondary | ICD-10-CM | POA: Insufficient documentation

## 2016-01-15 DIAGNOSIS — O200 Threatened abortion: Secondary | ICD-10-CM | POA: Diagnosis not present

## 2016-01-15 DIAGNOSIS — O209 Hemorrhage in early pregnancy, unspecified: Secondary | ICD-10-CM | POA: Diagnosis not present

## 2016-01-15 NOTE — Telephone Encounter (Signed)
Called pt and informed her of [place] result, pt denies any pain at this time and states bleeding is slowing down at this time.  Scheduled a BHCG on 01-16-16 and 01-18-16.  Instructed pt to go to [facility] if she starts to experience any severe abdominal pain that is increasing in intensity. Pt acknowledged instructions.

## 2016-01-15 NOTE — Telephone Encounter (Signed)
[facility] [place] dept called with [place] report.  US showed below results.  RN Corene Cornea will call pt for stat beta hcg and assess for any abdominal pain or signs of ectopic pregnancy.  Will determine plan based on b hcg results.  EXAM: OBSTETRIC <14 WK [place] AND TRANSVAGINAL OB US  TECHNIQUE: Both transabdominal and transvaginal ultrasound examinations were performed for complete evaluation of the gestation as well as the maternal uterus, adnexal regions, and pelvic cul-de-sac. Transvaginal technique was performed to assess early pregnancy.  COMPARISON: None in PACs  FINDINGS: Intrauterine gestational sac: None visualized.  Yolk sac: None visualized  Embryo: None visualized  Cardiac Activity: None visualized  Heart Rate: n/a bpm  No gestational sac or fetal pole was observed.  Subchorionic hemorrhage: None visualized.  Maternal uterus/adnexae: The uterus measures 9.5 x 3.4 x 5.3 cm. The endometrial stripe measures 10.8 mm. No intraluminal from fluid or gestational sac is of observed. The ovaries are normal bilaterally.  IMPRESSION: No IUP is observed. No retained products of conception are demonstrated. There are no findings suspicious for ectopic pregnancy.

## 2016-01-15 NOTE — Telephone Encounter (Signed)
Pt is early pregnancy, approximately 5 weeks 2 days based on LMP, called this morning c/o bleeding and passing small clots.  Reviewed case with Dr Harolyn Rutherford, ordered US to check viability.

## 2016-01-16 ENCOUNTER — Other Ambulatory Visit (INDEPENDENT_AMBULATORY_CARE_PROVIDER_SITE_OTHER): Payer: 59 | Admitting: *Deleted

## 2016-01-16 DIAGNOSIS — O200 Threatened abortion: Secondary | ICD-10-CM | POA: Diagnosis not present

## 2016-01-16 NOTE — Progress Notes (Signed)
Pt had + UPT, called c/o bleeding and passing clots, approximately 5 weeks 2 days based on LMP, had [place] at hospital and did not see FHR, yolk sac, or pole.  Recommended BHCG follow-up. Here for BHCG.

## 2016-01-17 LAB — HCG, QUANTITATIVE, PREGNANCY: hCG, Beta Chain, Quant, S: <2 m[IU]/mL

## 2016-01-18 ENCOUNTER — Other Ambulatory Visit: Payer: 59

## 2016-02-01 MED FILL — PRENATAL VITAMIN PLUS LOW I: 27-1 | 90 days supply | Qty: 90 | Fill #1 | Status: TO

## 2016-02-12 ENCOUNTER — Other Ambulatory Visit (INDEPENDENT_AMBULATORY_CARE_PROVIDER_SITE_OTHER): Payer: 59 | Admitting: *Deleted

## 2016-02-12 DIAGNOSIS — Z3201 Encounter for pregnancy test, result positive: Secondary | ICD-10-CM | POA: Diagnosis not present

## 2016-02-12 NOTE — Progress Notes (Signed)
Pt had a SAB last month, had a + pregnancy test at home yesterday.  Stated her last LMP was 01-15-16.  EDD 09-23-16 based on LMP.  Would be 4w 1d today based on LMP.  BHCG drawn to confirm pregnancy.  Pt will f/u on Thursday for rpt BHCG to make sure it is increasing appropriately.

## 2016-02-13 LAB — HCG, QUANTITATIVE, PREGNANCY: HCG, BETA CHAIN, QUANT, S: 45.5 m[IU]/mL — AB

## 2016-02-14 ENCOUNTER — Encounter: Payer: Self-pay | Admitting: Family Medicine

## 2016-02-14 ENCOUNTER — Other Ambulatory Visit (INDEPENDENT_AMBULATORY_CARE_PROVIDER_SITE_OTHER): Payer: 59 | Admitting: *Deleted

## 2016-02-14 DIAGNOSIS — O0281 Inappropriate change in quantitative human chorionic gonadotropin (hCG) in early pregnancy: Secondary | ICD-10-CM

## 2016-02-14 DIAGNOSIS — Z3201 Encounter for pregnancy test, result positive: Secondary | ICD-10-CM

## 2016-02-14 NOTE — Progress Notes (Signed)
Pt here today for repeat BHCG.

## 2016-02-15 ENCOUNTER — Telehealth: Payer: Self-pay | Admitting: *Deleted

## 2016-02-15 LAB — HCG, QUANTITATIVE, PREGNANCY: HCG, BETA CHAIN, QUANT, S: 99 m[IU]/mL — AB

## 2016-02-15 NOTE — Telephone Encounter (Signed)
Pt requesting BHCG result, informed her of the increase in the HCG and made appt in three weeks for Initial OB and [place].

## 2016-02-19 ENCOUNTER — Telehealth: Payer: Self-pay | Admitting: *Deleted

## 2016-02-19 NOTE — Telephone Encounter (Signed)
Pt called in states she started brown vaginal spotting 2 days ago and today the vaginal spotting is pink. I offered for pt to come in for quant to check to see if levels are continuing to rise. Pt will be in tomorrow 02/20/16

## 2016-02-20 ENCOUNTER — Other Ambulatory Visit (INDEPENDENT_AMBULATORY_CARE_PROVIDER_SITE_OTHER): Payer: 59 | Admitting: *Deleted

## 2016-02-20 ENCOUNTER — Telehealth: Payer: Self-pay | Admitting: *Deleted

## 2016-02-20 DIAGNOSIS — O200 Threatened abortion: Secondary | ICD-10-CM | POA: Diagnosis not present

## 2016-02-20 LAB — HCG, QUANTITATIVE, PREGNANCY: HCG, BETA CHAIN, QUANT, S: 509.1 m[IU]/mL — AB

## 2016-02-20 NOTE — Telephone Encounter (Signed)
Called pt to go over hcg levels of 509. Stated to pt that the levels are increasing despite the vaginal spotting. Gave pt option to have formal US done at [facility] or wait until New OB visit in a couple of weeks. Pt opted to wait.

## 2016-02-20 NOTE — Progress Notes (Signed)
Patient here for quant HCG only. HCG today 501 from 99 (4 days ago). Patient should keep new ob appointment as scheduled. Ectopic precautions were reviewed with the patient

## 2016-02-20 NOTE — Progress Notes (Signed)
History   Chief Complaint:  Labs Only   Oceana Cush is  30 y.o. G2P1001 Patient's last menstrual period was 01/15/2016.Marland Kitchen Patient is here for follow up of quantitative HCG and ongoing surveillance of pregnancy status.   She is Unknown weeks gestation by LMP.    Since her last visit, the patient is with new complaint.   The patient reports light spotting that started 2 days ago that was brown in color that changed the next day to pink.   Her previous Quantitative HCG values are: 45.5 on 02/12/16 and 99.0 on 02/14/16   Physical Exam  Last menstrual period 01/15/2016. Labs: Sent BHCG to lab for processing to see what levels are compared to earlier draws.   Ultrasound Studies:   Bedside US TVUS probe was not working and nothing seen abdominally. Pt should only be about 5 weeks if all is well.   Assessment:  Unknown weeks gestation here for ongoing surveillance of pregnancy.  Plan: Will call pt with BHCG results tomorrow.

## 2016-03-04 ENCOUNTER — Encounter: Payer: Self-pay | Admitting: *Deleted

## 2016-03-04 ENCOUNTER — Telehealth: Payer: Self-pay | Admitting: *Deleted

## 2016-03-04 NOTE — Telephone Encounter (Signed)
Pt called in stated she has still had vaginal spotting that is brown in color some days and red other days. Spotting has not increased to full vaginal bleeding and no abd pain. Adv pt that if bleeding increases, increased pain, etc to be eval at [facility] otherwise we will see her at her next visit. Pt expressed understanding.

## 2016-03-06 ENCOUNTER — Encounter: Payer: Self-pay | Admitting: Family Medicine

## 2016-03-06 ENCOUNTER — Ambulatory Visit (INDEPENDENT_AMBULATORY_CARE_PROVIDER_SITE_OTHER): Payer: 59 | Admitting: Family Medicine

## 2016-03-06 VITALS — BP 119/78 | HR 114 | Wt 121.0 lb

## 2016-03-06 DIAGNOSIS — O200 Threatened abortion: Secondary | ICD-10-CM

## 2016-03-06 NOTE — Progress Notes (Signed)
   Subjective:    Patient ID: Margaret Schwartz is a 30 y.o. female presenting with Initial Prenatal Visit  on 03/06/2016  HPI: Here for new OB. Spotting and worried.  Has had appropriately rising BHCGs. Had essentially chemical pregnancy only last month. Bled through last pregnancy and postpartum. Carries MTHFR. Vigorous work-up done previously for VWB.  Review of Systems   + breast tenderness No Fever, chills, chest pain, SOB + nausea Objective:    BP 119/78 mmHg  Pulse 114  Wt 121 lb (54.885 kg)  LMP 01/15/2016 Physical Exam  Gen - WD/WN female Abd - soft, NT Lungs - normal effort Ext - no cyanosis, edema GU - NEFG  TVUS reveals SIUGS measuring 5 wk 2 day, no fetal pole, no yolk sac. Normal appearing adnexa.      Assessment & Plan:   Problem List Items Addressed This Visit    None    Visit Diagnoses    Threatened abortion in first trimester    -  Primary    Dating could be off or could be miscarriage--will return for u/s and NewOB if indicated in 1 wk. If miscarriage options of D and C vs. cytotec discussed.      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 Over 50% of encounter was spent on counseling and coordination of care. Return in 1 week (on 03/13/2016) for a follow-up US.  Julen Rubert S 03/06/2016 1:34 PM

## 2016-03-06 NOTE — Patient Instructions (Signed)
First Trimester of Pregnancy The first trimester of pregnancy is from week 1 until the end of week 12 (months 1 through 3). A week after a sperm fertilizes an egg, the egg will implant on the wall of the uterus. This embryo will begin to develop into a baby. Genes from you and your partner are forming the baby. The female genes determine whether the baby is a boy or a girl. At 6-8 weeks, the eyes and face are formed, and the heartbeat can be seen on ultrasound. At the end of 12 weeks, all the baby's organs are formed.  Now that you are pregnant, you will want to do everything you can to have a healthy baby. Two of the most important things are to get good prenatal care and to follow your health care provider's instructions. Prenatal care is all the medical care you receive before the baby's birth. This care will help prevent, find, and treat any problems during the pregnancy and childbirth. BODY CHANGES Your body goes through many changes during pregnancy. The changes vary from woman to woman.   You may gain or lose a couple of pounds at first.  You may feel sick to your stomach (nauseous) and throw up (vomit). If the vomiting is uncontrollable, call your health care provider.  You may tire easily.  You may develop headaches that can be relieved by medicines approved by your health care provider.  You may urinate more often. Painful urination may mean you have a bladder infection.  You may develop heartburn as a result of your pregnancy.  You may develop constipation because certain hormones are causing the muscles that push waste through your intestines to slow down.  You may develop hemorrhoids or swollen, bulging veins (varicose veins).  Your breasts may begin to grow larger and become tender. Your nipples may stick out more, and the tissue that surrounds them (areola) may become darker.  Your gums may bleed and may be sensitive to brushing and flossing.  Dark spots or blotches (chloasma,  mask of pregnancy) may develop on your face. This will likely fade after the baby is born.  Your menstrual periods will stop.  You may have a loss of appetite.  You may develop cravings for certain kinds of food.  You may have changes in your emotions from day to day, such as being excited to be pregnant or being concerned that something may go wrong with the pregnancy and baby.  You may have more vivid and strange dreams.  You may have changes in your hair. These can include thickening of your hair, rapid growth, and changes in texture. Some women also have hair loss during or after pregnancy, or hair that feels dry or thin. Your hair will most likely return to normal after your baby is born. WHAT TO EXPECT AT YOUR PRENATAL VISITS During a routine prenatal visit:  You will be weighed to make sure you and the baby are growing normally.  Your blood pressure will be taken.  Your abdomen will be measured to track your baby's growth.  The fetal heartbeat will be listened to starting around week 10 or 12 of your pregnancy.  Test results from any previous visits will be discussed. Your health care provider may ask you:  How you are feeling.  If you are feeling the baby move.  If you have had any abnormal symptoms, such as leaking fluid, bleeding, severe headaches, or abdominal cramping.  If you are using any tobacco products,   including cigarettes, chewing tobacco, and electronic cigarettes.  If you have any questions. Other tests that may be performed during your first trimester include:  Blood tests to find your blood type and to check for the presence of any previous infections. They will also be used to check for low iron levels (anemia) and Rh antibodies. Later in the pregnancy, blood tests for diabetes will be done along with other tests if problems develop.  Urine tests to check for infections, diabetes, or protein in the urine.  An ultrasound to confirm the proper growth  and development of the baby.  An amniocentesis to check for possible genetic problems.  Fetal screens for spina bifida and Down syndrome.  You may need other tests to make sure you and the baby are doing well.  HIV (human immunodeficiency virus) testing. Routine prenatal testing includes screening for HIV, unless you choose not to have this test. HOME CARE INSTRUCTIONS  Medicines  Follow your health care provider's instructions regarding medicine use. Specific medicines may be either safe or unsafe to take during pregnancy.  Take your prenatal vitamins as directed.  If you develop constipation, try taking a stool softener if your health care provider approves. Diet  Eat regular, well-balanced meals. Choose a variety of foods, such as meat or vegetable-based protein, fish, milk and low-fat dairy products, vegetables, fruits, and whole grain breads and cereals. Your health care provider will help you determine the amount of weight gain that is right for you.  Avoid raw meat and uncooked cheese. These carry germs that can cause birth defects in the baby.  Eating four or five small meals rather than three large meals a day may help relieve nausea and vomiting. If you start to feel nauseous, eating a few soda crackers can be helpful. Drinking liquids between meals instead of during meals also seems to help nausea and vomiting.  If you develop constipation, eat more high-fiber foods, such as fresh vegetables or fruit and whole grains. Drink enough fluids to keep your urine clear or pale yellow. Activity and Exercise  Exercise only as directed by your health care provider. Exercising will help you:  Control your weight.  Stay in shape.  Be prepared for labor and delivery.  Experiencing pain or cramping in the lower abdomen or low back is a good sign that you should stop exercising. Check with your health care provider before continuing normal exercises.  Try to avoid standing for long  periods of time. Move your legs often if you must stand in one place for a long time.  Avoid heavy lifting.  Wear low-heeled shoes, and practice good posture.  You may continue to have sex unless your health care provider directs you otherwise. Relief of Pain or Discomfort  Wear a good support bra for breast tenderness.   Take warm sitz baths to soothe any pain or discomfort caused by hemorrhoids. Use hemorrhoid cream if your health care provider approves.   Rest with your legs elevated if you have leg cramps or low back pain.  If you develop varicose veins in your legs, wear support hose. Elevate your feet for 15 minutes, 3-4 times a day. Limit salt in your diet. Prenatal Care  Schedule your prenatal visits by the twelfth week of pregnancy. They are usually scheduled monthly at first, then more often in the last 2 months before delivery.  Write down your questions. Take them to your prenatal visits.  Keep all your prenatal visits as directed by your   health care provider. Safety  Wear your seat belt at all times when driving.  Make a list of emergency phone numbers, including numbers for family, friends, the hospital, and police and fire departments. General Tips  Ask your health care provider for a referral to a local prenatal education class. Begin classes no later than at the beginning of month 6 of your pregnancy.  Ask for help if you have counseling or nutritional needs during pregnancy. Your health care provider can offer advice or refer you to specialists for help with various needs.  Do not use hot tubs, steam rooms, or saunas.  Do not douche or use tampons or scented sanitary pads.  Do not cross your legs for long periods of time.  Avoid cat litter boxes and soil used by cats. These carry germs that can cause birth defects in the baby and possibly loss of the fetus by miscarriage or stillbirth.  Avoid all smoking, herbs, alcohol, and medicines not prescribed by  your health care provider. Chemicals in these affect the formation and growth of the baby.  Do not use any tobacco products, including cigarettes, chewing tobacco, and electronic cigarettes. If you need help quitting, ask your health care provider. You may receive counseling support and other resources to help you quit.  Schedule a dentist appointment. At home, brush your teeth with a soft toothbrush and be gentle when you floss. SEEK MEDICAL CARE IF:   You have dizziness.  You have mild pelvic cramps, pelvic pressure, or nagging pain in the abdominal area.  You have persistent nausea, vomiting, or diarrhea.  You have a bad smelling vaginal discharge.  You have pain with urination.  You notice increased swelling in your face, hands, legs, or ankles. SEEK IMMEDIATE MEDICAL CARE IF:   You have a fever.  You are leaking fluid from your vagina.  You have spotting or bleeding from your vagina.  You have severe abdominal cramping or pain.  You have rapid weight gain or loss.  You vomit blood or material that looks like coffee grounds.  You are exposed to German measles and have never had them.  You are exposed to fifth disease or chickenpox.  You develop a severe headache.  You have shortness of breath.  You have any kind of trauma, such as from a fall or a car accident.   This information is not intended to replace advice given to you by your health care provider. Make sure you discuss any questions you have with your health care provider.   Document Released: 10/28/2001 Document Revised: 11/24/2014 Document Reviewed: 09/13/2013 Elsevier Interactive Patient Education 2016 [employer] Inc.  

## 2016-03-06 NOTE — Progress Notes (Signed)
Pt here for initial prenatal visit. According to LMP pt should be about [redacted]w[redacted]d. Bedside US shows gestational sac only measuring about 5wks.  Pt has questions about the MTHFR mutation and taking additional folic acid.

## 2016-03-13 ENCOUNTER — Ambulatory Visit (INDEPENDENT_AMBULATORY_CARE_PROVIDER_SITE_OTHER): Payer: 59 | Admitting: Family Medicine

## 2016-03-13 VITALS — BP 106/73 | HR 103 | Wt 120.0 lb

## 2016-03-13 DIAGNOSIS — O021 Missed abortion: Secondary | ICD-10-CM

## 2016-03-13 LAB — CBC
HCT: 39.5 % (ref 35.0–45.0)
Hemoglobin: 13.2 g/dL (ref 11.7–15.5)
MCH: 29.3 pg (ref 27.0–33.0)
MCHC: 33.4 g/dL (ref 32.0–36.0)
MCV: 87.8 fL (ref 80.0–100.0)
MPV: 8.8 fL (ref 7.5–12.5)
Platelets: 297 10*3/uL (ref 140–400)
RBC: 4.5 MIL/uL (ref 3.80–5.10)
RDW: 13.2 % (ref 11.0–15.0)
WBC: 6.8 10*3/uL (ref 3.8–10.8)

## 2016-03-13 LAB — FERRITIN: FERRITIN: 29 ng/mL (ref 10–154)

## 2016-03-13 NOTE — Assessment & Plan Note (Signed)
Given lack of progression suspect this is a missed ab with bighted ovum. Options discussed with patient including cytotec, D & C or expectant management. Risks/Benefits/Alternatives of each were discussed. Patient elects for watchful waiting for now.

## 2016-03-13 NOTE — Patient Instructions (Signed)
Tamer Kerin Perna is the REI specialist I mentioned - [place] Incomplete Miscarriage A miscarriage is the sudden loss of an unborn baby (fetus) before the 20th week of pregnancy. In an incomplete miscarriage, parts of the fetus or placenta (afterbirth) remain in the body.  Having a miscarriage can be an emotional experience. Talk with your health care provider about any questions you may have about miscarrying, the grieving process, and your future pregnancy plans. CAUSES   Problems with the fetal chromosomes that make it impossible for the baby to develop normally. Problems with the baby's genes or chromosomes are most often the result of errors that occur by chance as the embryo divides and grows. The problems are not inherited from the parents.  Infection of the cervix or uterus.  Hormone problems.  Problems with the cervix, such as having an incompetent cervix. This is when the tissue in the cervix is not strong enough to hold the pregnancy.  Problems with the uterus, such as an abnormally shaped uterus, uterine fibroids, or congenital abnormalities.  Certain medical conditions.  Smoking, drinking alcohol, or taking illegal drugs.  Trauma. SYMPTOMS   Vaginal bleeding or spotting, with or without cramps or pain.  Pain or cramping in the abdomen or lower back.  Passing fluid, tissue, or blood clots from the vagina. DIAGNOSIS  Your health care provider will perform a physical exam. You may also have an ultrasound to confirm the miscarriage. Blood or urine tests may also be ordered. TREATMENT   Usually, a dilation and curettage (D&C) procedure is performed. During a D&C procedure, the cervix is widened (dilated) and any remaining fetal or placental tissue is gently removed from the uterus.  Antibiotic medicines are prescribed if there is an infection. Other medicines may be given to reduce the size of the uterus (contract) if there is a lot of bleeding.  If  you have Rh negative blood and your baby was Rh positive, you will need a Rho (D) immune globulin shot. This shot will protect any future baby from having Rh blood problems in future pregnancies.  You may be confined to bed rest. This means you should stay in bed and only get up to use the bathroom. HOME CARE INSTRUCTIONS   Rest as directed by your health care provider.  Restrict activity as directed by your health care provider. You may be allowed to continue light activity if curettage was not done but you require further treatment.  Keep track of the number of pads you use each day. Keep track of how soaked (saturated) they are. Record this information.  Do not  use tampons.  Do not douche or have sexual intercourse until approved by your health care provider.  Keep all follow-up appointments for reevaluation and continuing management.  Only take over-the-counter or prescription medicines for pain, fever, or discomfort as directed by your health care provider.  Take antibiotic medicine as directed by your health care provider. Make sure you finish it even if you start to feel better. SEEK IMMEDIATE MEDICAL CARE IF:   You experience severe cramps in your stomach, back, or abdomen.  You have an unexplained temperature (make sure to record these temperatures).  You pass large clots or tissue (save these for your health care provider to inspect).  Your bleeding increases.  You become light-headed, weak, or have fainting episodes. MAKE SURE YOU:   Understand these instructions.  Will watch your condition.  Will get help right away if you are not doing  well or get worse.   This information is not intended to replace advice given to you by your health care provider. Make sure you discuss any questions you have with your health care provider.   Document Released: 11/03/2005 Document Revised: 11/24/2014 Document Reviewed: 06/02/2013 Elsevier Interactive Patient Education 2016  [place]

## 2016-03-13 NOTE — Progress Notes (Signed)
Has been bleeding/ spotting since 02/18/16.  Wants a CBC or ferritin level drawn.  She does feel dizzy.

## 2016-03-13 NOTE — Progress Notes (Signed)
   Subjective:    Patient ID: Trishia Carawan is a 30 y.o. female presenting with Routine Prenatal Visit  on 03/13/2016  HPI: Previously seen last week with bleeding and threatened ab. Here for repeat u/s. Notes continued bleeding.  Review of Systems   feels dizzy with standing Has not passed tissue Occasional nausea Breast tenderness Otherwise negative ROS Objective:    BP 106/73 mmHg  Pulse 103  Wt 120 lb (54.432 kg)  LMP 01/15/2016 Physical Exam  Gen - WD/WN female, tearful Lungs - normal effort CV - Reg rate and rhythm Abd - soft, NT Skin - no rash   TVUS reveals IUGS measuring 5 wks, no fetal pole and no yolk sac Assessment & Plan:   Problem List Items Addressed This Visit      Unprioritized   Missed abortion - Primary    Given lack of progression suspect this is a missed ab with bighted ovum. Options discussed with patient including cytotec, D & C or expectant management. Risks/Benefits/Alternatives of each were discussed. Patient elects for watchful waiting for now.      Relevant Orders   CBC   Ferritin   ABO AND RH    Antibody screen     Total face-to-face time with patient: 15 minutes. Over 50% of encounter was spent on counseling and coordination of care.  Return in about 2 weeks (around 03/27/2016) for a follow-up.  PRATT,TANYA S 03/13/2016 1:40 PM

## 2016-03-14 ENCOUNTER — Ambulatory Visit: Payer: Self-pay | Admitting: Family Medicine

## 2016-03-14 LAB — ABO AND RH: Rh Type: POSITIVE

## 2016-03-14 LAB — ANTIBODY SCREEN: Antibody Screen: NEGATIVE

## 2016-03-28 ENCOUNTER — Telehealth: Payer: 59 | Admitting: Family

## 2016-03-28 ENCOUNTER — Telehealth: Payer: Self-pay | Admitting: *Deleted

## 2016-03-28 DIAGNOSIS — J019 Acute sinusitis, unspecified: Secondary | ICD-10-CM

## 2016-03-28 MED ORDER — AMOXICILLIN-POT CLAVULANATE 875-125 MG PO TABS: 1.0000 | ORAL_TABLET | Freq: Two times a day (BID) | ORAL | Status: DC

## 2016-03-28 MED FILL — AMOX TR-K CLV 875-125 MG TA: 875-125 | 7 days supply | Qty: 14 | Fill #0

## 2016-03-28 NOTE — Progress Notes (Signed)

## 2016-03-28 NOTE — Telephone Encounter (Signed)
-----   Message from Francia Greaves sent at 03/28/2016  8:22 AM EDT ----- Regarding: SAB Advise No real cramping or bleeding, passed a cot about the size of her hand, not really sure what to expect

## 2016-03-28 NOTE — Telephone Encounter (Signed)
Pt has been bleeding on and off since 02-18-16.  States she had a large clot pass approximately the size of her hand.  Informed pt that she could be passing products of conception and tissue related to the pregnancy.  Pt acknowledged and scheduled follow-up with Dr Kennon Rounds on 04-15-16.

## 2016-04-11 ENCOUNTER — Telehealth: Payer: 59 | Admitting: Family

## 2016-04-11 DIAGNOSIS — B373 Candidiasis of vulva and vagina: Secondary | ICD-10-CM | POA: Diagnosis not present

## 2016-04-11 DIAGNOSIS — B3731 Acute candidiasis of vulva and vagina: Secondary | ICD-10-CM

## 2016-04-11 MED ORDER — FLUCONAZOLE 150 MG PO TABS
150.0000 mg | ORAL_TABLET | Freq: Once | ORAL | Status: DC
Start: 2016-04-11 — End: 2016-04-15

## 2016-04-11 MED FILL — FLUCONAZOLE 150 MG TABLET: 150 | 1 days supply | Qty: 1 | Fill #0

## 2016-04-11 NOTE — Progress Notes (Signed)

## 2016-04-15 ENCOUNTER — Encounter: Payer: Self-pay | Admitting: Family Medicine

## 2016-04-15 ENCOUNTER — Ambulatory Visit (INDEPENDENT_AMBULATORY_CARE_PROVIDER_SITE_OTHER): Payer: 59 | Admitting: Family Medicine

## 2016-04-15 VITALS — BP 109/73 | HR 78 | Resp 16 | Ht 63.0 in | Wt 121.0 lb

## 2016-04-15 DIAGNOSIS — O039 Complete or unspecified spontaneous abortion without complication: Secondary | ICD-10-CM

## 2016-04-15 NOTE — Patient Instructions (Signed)
Preparing for Pregnancy Before trying to become pregnant, make an appointment with your health care provider (preconception care). The goal is to help you have a healthy, safe pregnancy. At your first appointment, your health care provider will:   Do a complete physical exam, including a Pap test.  Take a complete medical history.  Give you advice and help you resolve any problems. PRECONCEPTION CHECKLIST Here is a list of the basics to cover with your health care provider at your preconception visit:  Medical history.  Tell your health care provider about any diseases you have had. Many diseases can affect your pregnancy.  Include your partner's medical history and family history.  Make sure you have been tested for sexually transmitted infections (STIs). These can affect your pregnancy. In some cases, they can be passed to your baby. Tell your health care provider about any history of STIs.  Make sure your health care provider knows about any previous problems you have had with conception or pregnancy.  Tell your health care provider about any medicine you take. This includes herbal supplements and over-the-counter medicines.  Make sure all your immunizations are up to date. You may need to make additional appointments.  Ask your health care provider if you need any vaccinations or if there are any you should avoid.  Diet.  It is especially important to eat a healthy, balanced diet with the right nutrients when you are pregnant.  Ask your health care provider to help you get to a healthy weight before pregnancy.  If you are overweight, you are at higher risk for certain complications. These include high blood pressure, diabetes, and preterm birth.  If you are underweight, you are more likely to have a low-birth-weight baby.  Lifestyle.  Tell your health care provider about lifestyle factors such as alcohol use, drug use, or smoking.  Describe any harmful substances you may  be exposed to at work or home. These can include chemicals, pesticides, and radiation.  Mental health.  Let your health care provider know if you have been feeling depressed or anxious.  Let your health care provider know if you have a history of substance abuse.  Let your health care provider know if you do not feel safe at home. HOME INSTRUCTIONS TO PREPARE FOR PREGNANCY Follow your health care provider's advice and instructions.   Keep an accurate record of your menstrual periods. This makes it easier for your health care provider to determine your baby's due date.  Begin taking prenatal vitamins and folic acid supplements daily. Take them as directed by your health care provider.  Eat a balanced diet. Get help from a nutrition counselor if you have questions or need help.  Get regular exercise. Try to be active for at least 30 minutes a day most days of the week.  Quit smoking, if you smoke.  Do not drink alcohol.  Do not take illegal drugs.  Get medical problems, such as diabetes or high blood pressure, under control.  If you have diabetes, make sure you do the following:  Have good blood sugar control. If you have type 1 diabetes, use multiple daily doses of insulin. Do not use split-dose or premixed insulin.  Have an eye exam by a qualified eye care professional trained in caring for people with diabetes.  Get evaluated by your health care provider for cardiovascular disease.  Get to a healthy weight. If you are overweight or obese, reduce your weight with the help of a qualified health   professional such as a Firefighter. Ask your health care provider what the right weight range is for you. HOW DO I KNOW I AM PREGNANT? You may be pregnant if you have been sexually active and you miss your period. Symptoms of early pregnancy include:   Mild cramping.  Very light vaginal bleeding (spotting).  Feeling unusually tired.  Morning sickness. If you have any of  these symptoms, take a home pregnancy test. These tests look for a hormone called human chorionic gonadotropin (hCG) in your urine. Your body begins to make this hormone during early pregnancy. These tests are very accurate. Wait until at least the first day you miss your period to take one. If you get a positive result, call your health care provider to make appointments for prenatal care. WHAT SHOULD I DO IF I BECOME PREGNANT?  Make an appointment with your health care provider by week 12 of your pregnancy at the latest.  Do not smoke. Smoking can be harmful to your baby.  Do not drink alcoholic beverages. Alcohol is related to a number of birth defects.  Avoid toxic odors and chemicals.  You may continue to have sexual intercourse if it does not cause pain or other problems, such as vaginal bleeding.   This information is not intended to replace advice given to you by your health care provider. Make sure you discuss any questions you have with your health care provider.   Document Released: 10/16/2008 Document Revised: 11/24/2014 Document Reviewed: 10/10/2013 Elsevier Interactive Patient Education [employer].

## 2016-04-15 NOTE — Progress Notes (Signed)
    Subjective:    Patient ID: Margaret Schwartz is a 30 y.o. female presenting with Follow-up  on 04/15/2016  HPI: Here to f/u missed AB. Bled for some time. Still with sac on 4/27 and bleeding. Then had heavy bleeding on 5/4 and 5/5. Took ovulation test on 5/20 and it was positive. Then had clot on 5/25. No further bleeding since that time.  Review of Systems  Constitutional: Negative for fever and chills.  Respiratory: Negative for shortness of breath.   Cardiovascular: Negative for chest pain.  Gastrointestinal: Negative for nausea, vomiting and abdominal pain.  Genitourinary: Negative for dysuria.  Skin: Negative for rash.      Objective:    BP 109/73 mmHg  Pulse 78  Resp 16  Ht 5\' 3"  (1.6 m)  Wt 121 lb (54.885 kg)  BMI 21.44 kg/m2  LMP 01/15/2016  Breastfeeding? No Physical Exam  Constitutional: She is oriented to person, place, and time. She appears well-developed and well-nourished. No distress.  HENT:  Head: Normocephalic and atraumatic.  Eyes: No scleral icterus.  Neck: Neck supple.  Cardiovascular: Normal rate.   Pulmonary/Chest: Effort normal.  Abdominal: Soft.  Neurological: She is alert and oriented to person, place, and time.  Skin: Skin is warm and dry.  Psychiatric: She has a normal mood and affect.        Assessment & Plan:  SAB (spontaneous abortion) - completed--no signs of depression May resume trying to conceive at her discretion. Uninterested in birth control right now.  Total face-to-face time with patient: 15 minutes. Over 50% of encounter was spent on counseling and coordination of care. Return in about 3 months (around 07/16/2016).  Gregary Blackard S 04/15/2016 10:02 AM

## 2016-04-17 DIAGNOSIS — H5213 Myopia, bilateral: Secondary | ICD-10-CM | POA: Diagnosis not present

## 2016-04-17 DIAGNOSIS — H52221 Regular astigmatism, right eye: Secondary | ICD-10-CM | POA: Diagnosis not present

## 2016-06-16 MED FILL — PRENATAL VITAMIN PLUS LOW I: 27-1 | 90 days supply | Qty: 90 | Fill #0

## 2016-06-17 DIAGNOSIS — O4690 Antepartum hemorrhage, unspecified, unspecified trimester: Secondary | ICD-10-CM | POA: Diagnosis not present

## 2016-06-17 DIAGNOSIS — Z3201 Encounter for pregnancy test, result positive: Secondary | ICD-10-CM | POA: Diagnosis not present

## 2016-06-17 DIAGNOSIS — E7212 Methylenetetrahydrofolate reductase deficiency: Secondary | ICD-10-CM | POA: Diagnosis not present

## 2016-06-18 MED FILL — PROGESTERONE 200 MG CAPSULE: 200 | 30 days supply | Qty: 30 | Fill #0

## 2016-06-19 DIAGNOSIS — Z3A01 Less than 8 weeks gestation of pregnancy: Secondary | ICD-10-CM | POA: Diagnosis not present

## 2016-06-19 DIAGNOSIS — O4690 Antepartum hemorrhage, unspecified, unspecified trimester: Secondary | ICD-10-CM | POA: Diagnosis not present

## 2016-06-23 DIAGNOSIS — Z3A01 Less than 8 weeks gestation of pregnancy: Secondary | ICD-10-CM | POA: Diagnosis not present

## 2016-06-23 DIAGNOSIS — O09291 Supervision of pregnancy with other poor reproductive or obstetric history, first trimester: Secondary | ICD-10-CM | POA: Diagnosis not present

## 2016-07-02 DIAGNOSIS — Z3201 Encounter for pregnancy test, result positive: Secondary | ICD-10-CM | POA: Diagnosis not present

## 2016-07-02 MED FILL — FOLIC ACID 1 MG TABLET: 1 | 30 days supply | Qty: 120 | Fill #0

## 2016-07-14 MED FILL — PROMETRIUM 200 MG CAPSULE: 200 | 30 days supply | Qty: 30 | Fill #1

## 2016-07-18 DIAGNOSIS — Z3481 Encounter for supervision of other normal pregnancy, first trimester: Secondary | ICD-10-CM | POA: Diagnosis not present

## 2016-07-18 DIAGNOSIS — O99280 Endocrine, nutritional and metabolic diseases complicating pregnancy, unspecified trimester: Secondary | ICD-10-CM | POA: Diagnosis not present

## 2016-07-18 DIAGNOSIS — Z3A01 Less than 8 weeks gestation of pregnancy: Secondary | ICD-10-CM | POA: Diagnosis not present

## 2016-07-18 LAB — OB RESULTS CONSOLE HIV ANTIBODY (ROUTINE TESTING): HIV: NONREACTIVE

## 2016-07-18 LAB — OB RESULTS CONSOLE RPR: RPR: NONREACTIVE

## 2016-07-18 LAB — OB RESULTS CONSOLE HEPATITIS B SURFACE ANTIGEN: Hepatitis B Surface Ag: NEGATIVE

## 2016-07-18 LAB — OB RESULTS CONSOLE RUBELLA ANTIBODY, IGM: Rubella: IMMUNE

## 2016-07-18 MED FILL — DUET DHA BALANCED (25 MG IR: 25-1 & 267 | 30 days supply | Qty: 60 | Fill #0

## 2016-07-24 DIAGNOSIS — O99280 Endocrine, nutritional and metabolic diseases complicating pregnancy, unspecified trimester: Secondary | ICD-10-CM | POA: Diagnosis not present

## 2016-07-24 DIAGNOSIS — Z3A09 9 weeks gestation of pregnancy: Secondary | ICD-10-CM | POA: Diagnosis not present

## 2016-08-07 ENCOUNTER — Other Ambulatory Visit (HOSPITAL_COMMUNITY): Payer: Self-pay | Admitting: Obstetrics and Gynecology

## 2016-08-07 DIAGNOSIS — Z3A13 13 weeks gestation of pregnancy: Secondary | ICD-10-CM

## 2016-08-07 DIAGNOSIS — Z3682 Encounter for antenatal screening for nuchal translucency: Secondary | ICD-10-CM

## 2016-08-08 MED FILL — PROGESTERONE 200 MG CAPSULE: 200 | 30 days supply | Qty: 30 | Fill #2

## 2016-08-18 ENCOUNTER — Encounter (HOSPITAL_COMMUNITY): Payer: Self-pay

## 2016-08-18 ENCOUNTER — Ambulatory Visit (HOSPITAL_COMMUNITY): Payer: 59

## 2016-08-18 ENCOUNTER — Other Ambulatory Visit (HOSPITAL_COMMUNITY): Payer: 59

## 2016-08-21 DIAGNOSIS — Z3491 Encounter for supervision of normal pregnancy, unspecified, first trimester: Secondary | ICD-10-CM | POA: Diagnosis not present

## 2016-08-21 DIAGNOSIS — Z113 Encounter for screening for infections with a predominantly sexual mode of transmission: Secondary | ICD-10-CM | POA: Diagnosis not present

## 2016-08-21 DIAGNOSIS — Z3682 Encounter for antenatal screening for nuchal translucency: Secondary | ICD-10-CM | POA: Diagnosis not present

## 2016-08-21 DIAGNOSIS — Z36 Encounter for antenatal screening for chromosomal anomalies: Secondary | ICD-10-CM | POA: Diagnosis not present

## 2016-08-21 DIAGNOSIS — Z3A13 13 weeks gestation of pregnancy: Secondary | ICD-10-CM | POA: Diagnosis not present

## 2016-08-21 DIAGNOSIS — Z118 Encounter for screening for other infectious and parasitic diseases: Secondary | ICD-10-CM | POA: Diagnosis not present

## 2016-08-21 DIAGNOSIS — O99280 Endocrine, nutritional and metabolic diseases complicating pregnancy, unspecified trimester: Secondary | ICD-10-CM | POA: Diagnosis not present

## 2016-08-21 LAB — OB RESULTS CONSOLE GC/CHLAMYDIA
CHLAMYDIA, DNA PROBE: NEGATIVE
GC PROBE AMP, GENITAL: NEGATIVE

## 2016-08-28 MED FILL — FOLIC ACID 1 MG TABLET: 1 | 30 days supply | Qty: 120 | Fill #1

## 2016-09-09 DIAGNOSIS — Z361 Encounter for antenatal screening for raised alphafetoprotein level: Secondary | ICD-10-CM | POA: Diagnosis not present

## 2016-09-25 DIAGNOSIS — Z363 Encounter for antenatal screening for malformations: Secondary | ICD-10-CM | POA: Diagnosis not present

## 2016-10-14 MED FILL — FOLIC ACID 1 MG TABLET: 1 | 30 days supply | Qty: 120 | Fill #2

## 2016-10-14 MED FILL — DUET DHA BALANCED (25 MG IR: 25-1 & 267 | 30 days supply | Qty: 60 | Fill #1

## 2016-10-24 DIAGNOSIS — N898 Other specified noninflammatory disorders of vagina: Secondary | ICD-10-CM | POA: Diagnosis not present

## 2016-10-24 DIAGNOSIS — Z3A22 22 weeks gestation of pregnancy: Secondary | ICD-10-CM | POA: Diagnosis not present

## 2016-10-24 DIAGNOSIS — O26892 Other specified pregnancy related conditions, second trimester: Secondary | ICD-10-CM | POA: Diagnosis not present

## 2016-10-28 DIAGNOSIS — R350 Frequency of micturition: Secondary | ICD-10-CM | POA: Diagnosis not present

## 2016-10-28 DIAGNOSIS — O26892 Other specified pregnancy related conditions, second trimester: Secondary | ICD-10-CM | POA: Diagnosis not present

## 2016-10-28 DIAGNOSIS — Z3A23 23 weeks gestation of pregnancy: Secondary | ICD-10-CM | POA: Diagnosis not present

## 2016-10-28 DIAGNOSIS — R300 Dysuria: Secondary | ICD-10-CM | POA: Diagnosis not present

## 2016-10-28 DIAGNOSIS — N898 Other specified noninflammatory disorders of vagina: Secondary | ICD-10-CM | POA: Diagnosis not present

## 2016-10-28 MED FILL — NITROFURANTOIN MONO-MCR 100: 100 | 7 days supply | Qty: 14 | Fill #0

## 2016-11-03 ENCOUNTER — Ambulatory Visit (INDEPENDENT_AMBULATORY_CARE_PROVIDER_SITE_OTHER): Payer: 59 | Admitting: Family Medicine

## 2016-11-03 DIAGNOSIS — J019 Acute sinusitis, unspecified: Secondary | ICD-10-CM | POA: Insufficient documentation

## 2016-11-03 DIAGNOSIS — J0100 Acute maxillary sinusitis, unspecified: Secondary | ICD-10-CM | POA: Diagnosis not present

## 2016-11-03 MED ORDER — AMOXICILLIN-POT CLAVULANATE 875-125 MG PO TABS: 1.0000 | ORAL_TABLET | Freq: Two times a day (BID) | ORAL | 0 refills | Status: DC

## 2016-11-03 NOTE — Patient Instructions (Signed)
Augmentin if needed.  Feel better.  Take care  Dr. Lacinda Axon

## 2016-11-03 NOTE — Assessment & Plan Note (Signed)
New acute problem. Treating with Augmentin. 

## 2016-11-03 NOTE — Progress Notes (Signed)
Subjective:  Patient ID: Margaret Schwartz, female    DOB: [birth year]  Age: 30 y.o. MRN: [id number]  CC: Concern for Sinusitis  HPI:  30 year old female who is [redacted] weeks pregnant presents with the above complaint.  Patient states that she had severe sinus pain and pressure approximately 2 weeks ago. She reports associated purulent nasal discharge. She states that her symptoms improved with over-the-counter medication and then recurred 4 days ago. She reports right-sided maxillary sinus tenderness and discolored nasal discharge (green, brown). Pain/pressure is severe. No fever/chills. + sick contact (daughter). OTC medication without relief. No known exacerbating factors. No other associated symptoms. No other complaints at this time.  Social Hx   Social History   Social History  . Marital status: Married    Spouse name: N/A  . Number of children: N/A  . Years of education: N/A   Social History Main Topics  . Smoking status: Never Smoker  . Smokeless tobacco: Never Used  . Alcohol use No  . Drug use: No  . Sexual activity: Yes    Birth control/ protection: Condom   Other Topics Concern  . Not on file   Social History Narrative   Lives in [place] with husband and daughter.      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  HENT: Positive for sinus pain and sinus pressure.    Objective:  BP 96/63 (BP Location: Left Arm, Patient Position: Sitting, Cuff Size: Normal)   Pulse (!) 110   Temp 98.1 F (36.7 C) (Oral)   Resp 12   Wt 136 lb 4 oz (61.8 kg)   SpO2 100%   BMI 24.14 kg/m   BP/Weight 11/03/2016 04/15/2016 [id number]  Systolic BP 96 0000000 [id number]  Diastolic BP 63 73 73  Wt. (Lbs) 136.25 121 120  BMI 24.14 21.44 21.94   Physical Exam  Constitutional: She is oriented to person, place, and time. She appears well-developed. No distress.  HENT:  Mouth/Throat: Oropharynx is clear  and moist.  R sided maxillary sinus tenderness to palpation.  Cardiovascular: Normal rate and regular rhythm.   2/6 systolic murmur.  Pulmonary/Chest: Effort normal and breath sounds normal.  Lymphadenopathy:    She has cervical adenopathy.  Neurological: She is alert and oriented to person, place, and time.  Psychiatric: She has a normal mood and affect.  Vitals reviewed.  Lab Results  Component Value Date   WBC 6.8 03/13/2016   HGB 13.2 03/13/2016   HCT 39.5 03/13/2016   PLT 297 03/13/2016   GLUCOSE 72 07/11/2014   CHOL 145 07/11/2014   TRIG 58.0 07/11/2014   HDL 53.20 07/11/2014   LDLCALC 80 07/11/2014   ALT 16 07/11/2014   AST 16 07/11/2014   NA 137 07/11/2014   K 3.8 07/11/2014   CL 104 07/11/2014   CREATININE 0.5 07/11/2014   BUN 13 07/11/2014   CO2 26 07/11/2014   TSH 1.15 07/11/2014    Assessment & Plan:   Problem List Items Addressed This Visit    Acute sinusitis    New acute problem. Treating with Augmentin.      Relevant Medications   amoxicillin-clavulanate (AUGMENTIN) 875-125 MG tablet      Meds ordered this encounter  Medications  . Prenat-FePoly-Fered-FA-Omega 3 (DUET DHA BALANCED) 25-1 & 267 MG MISC    Refill:  12  . Prenatal Vit-Fe Fumarate-FA (PRENATAL MULTIVITAMIN) TABS tablet    Sig: Take 1 tablet by mouth daily at 12 noon.  . Pyridoxine HCl (VITAMIN B-6 PO)    Sig: Take by mouth.  . nitrofurantoin, macrocrystal-monohydrate, (MACROBID) 100 MG capsule    Refill:  0  . amoxicillin-clavulanate (AUGMENTIN) 875-125 MG tablet    Sig: Take 1 tablet by mouth 2 (two) times daily.    Dispense:  20 tablet    Refill:  0   Follow-up: PRN  [place]

## 2016-11-03 NOTE — Progress Notes (Signed)
Pre visit review using our clinic review tool, if applicable. No additional management support is needed unless otherwise documented below in the visit note. 

## 2016-11-12 DIAGNOSIS — Z3689 Encounter for other specified antenatal screening: Secondary | ICD-10-CM | POA: Diagnosis not present

## 2016-11-17 NOTE — L&D Delivery Note (Signed)
Delivery Note At 1:48 AM a viable and healthy female was delivered via Vaginal, Spontaneous Delivery (Presentation:VTX, ROA ;  ).  APGAR: 9, 9; weight  pending.   Placenta status: spontaneous, intact not sent , .  Cord:  with the following complications: .  Cord pH:  none  Anesthesia:  epidural Episiotomy: None Lacerations: 2nd degree perineal Suture Repair: 3.0 chromic Est. Blood Loss (mL): 250  Mom to postpartum.  Baby to Couplet care / Skin to Skin.  Calyx Hawker A 02/20/2017, 2:13 AM

## 2016-12-04 DIAGNOSIS — O4443 Low lying placenta NOS or without hemorrhage, third trimester: Secondary | ICD-10-CM | POA: Diagnosis not present

## 2016-12-04 DIAGNOSIS — Z3A28 28 weeks gestation of pregnancy: Secondary | ICD-10-CM | POA: Diagnosis not present

## 2016-12-04 DIAGNOSIS — Z3689 Encounter for other specified antenatal screening: Secondary | ICD-10-CM | POA: Diagnosis not present

## 2016-12-15 MED FILL — FOLIC ACID 1 MG TABLET: 1 | 30 days supply | Qty: 120 | Fill #3

## 2017-01-01 DIAGNOSIS — Z23 Encounter for immunization: Secondary | ICD-10-CM | POA: Diagnosis not present

## 2017-01-19 DIAGNOSIS — Z3A35 35 weeks gestation of pregnancy: Secondary | ICD-10-CM | POA: Diagnosis not present

## 2017-01-19 DIAGNOSIS — Z3685 Encounter for antenatal screening for Streptococcus B: Secondary | ICD-10-CM | POA: Diagnosis not present

## 2017-01-19 DIAGNOSIS — B373 Candidiasis of vulva and vagina: Secondary | ICD-10-CM | POA: Diagnosis not present

## 2017-01-19 DIAGNOSIS — O26893 Other specified pregnancy related conditions, third trimester: Secondary | ICD-10-CM | POA: Diagnosis not present

## 2017-01-19 LAB — OB RESULTS CONSOLE GBS: STREP GROUP B AG: NEGATIVE

## 2017-01-26 DIAGNOSIS — Z3A36 36 weeks gestation of pregnancy: Secondary | ICD-10-CM | POA: Diagnosis not present

## 2017-01-26 DIAGNOSIS — O3663X0 Maternal care for excessive fetal growth, third trimester, not applicable or unspecified: Secondary | ICD-10-CM | POA: Diagnosis not present

## 2017-02-16 LAB — HM PAP SMEAR

## 2017-02-19 ENCOUNTER — Inpatient Hospital Stay (HOSPITAL_COMMUNITY)
Admission: AD | Admit: 2017-02-19 | Discharge: 2017-02-21 | DRG: 775 | Disposition: A | Discharge status: Home / Self Care | Payer: 59 | Source: Ambulatory Visit | Attending: Obstetrics and Gynecology | Admitting: Obstetrics and Gynecology

## 2017-02-19 ENCOUNTER — Encounter (HOSPITAL_COMMUNITY): Payer: Self-pay

## 2017-02-19 DIAGNOSIS — Z823 Family history of stroke: Secondary | ICD-10-CM

## 2017-02-19 DIAGNOSIS — Z3A39 39 weeks gestation of pregnancy: Secondary | ICD-10-CM

## 2017-02-19 DIAGNOSIS — Z8249 Family history of ischemic heart disease and other diseases of the circulatory system: Secondary | ICD-10-CM

## 2017-02-19 DIAGNOSIS — Z833 Family history of diabetes mellitus: Secondary | ICD-10-CM

## 2017-02-19 DIAGNOSIS — Z3490 Encounter for supervision of normal pregnancy, unspecified, unspecified trimester: Secondary | ICD-10-CM

## 2017-02-19 NOTE — MAU Note (Signed)
Pt c/o contractions every 5 mins. Denies LOF. Has bloody show. +FM

## 2017-02-20 ENCOUNTER — Inpatient Hospital Stay (HOSPITAL_COMMUNITY): Payer: 59 | Admitting: Anesthesiology

## 2017-02-20 ENCOUNTER — Encounter (HOSPITAL_COMMUNITY): Payer: Self-pay

## 2017-02-20 DIAGNOSIS — Z3490 Encounter for supervision of normal pregnancy, unspecified, unspecified trimester: Secondary | ICD-10-CM

## 2017-02-20 DIAGNOSIS — Z833 Family history of diabetes mellitus: Secondary | ICD-10-CM | POA: Diagnosis not present

## 2017-02-20 DIAGNOSIS — Z823 Family history of stroke: Secondary | ICD-10-CM | POA: Diagnosis not present

## 2017-02-20 DIAGNOSIS — Z3A39 39 weeks gestation of pregnancy: Secondary | ICD-10-CM | POA: Diagnosis not present

## 2017-02-20 DIAGNOSIS — Z8249 Family history of ischemic heart disease and other diseases of the circulatory system: Secondary | ICD-10-CM | POA: Diagnosis not present

## 2017-02-20 DIAGNOSIS — Z3493 Encounter for supervision of normal pregnancy, unspecified, third trimester: Secondary | ICD-10-CM | POA: Diagnosis present

## 2017-02-20 LAB — TYPE AND SCREEN
ABO/RH(D): AB POS
Antibody Screen: NEGATIVE

## 2017-02-20 LAB — ABO/RH: ABO/RH(D): AB POS

## 2017-02-20 LAB — CBC
HEMATOCRIT: 35.6 % — AB (ref 36.0–46.0)
HEMOGLOBIN: 11.9 g/dL — AB (ref 12.0–15.0)
MCH: 30.1 pg (ref 26.0–34.0)
MCHC: 33.4 g/dL (ref 30.0–36.0)
MCV: 90.1 fL (ref 78.0–100.0)
Platelets: 261 10*3/uL (ref 150–400)
RBC: 3.95 MIL/uL (ref 3.87–5.11)
RDW: 14.1 % (ref 11.5–15.5)
WBC: 14.3 10*3/uL — ABNORMAL HIGH (ref 4.0–10.5)

## 2017-02-20 LAB — RPR: RPR: NONREACTIVE

## 2017-02-20 MED ORDER — COCONUT OIL OIL
1.0000 "application " | TOPICAL_OIL | Status: DC | PRN
  Administered 2017-02-20: 1 via TOPICAL
  Filled 2017-02-20: qty 120

## 2017-02-20 MED ORDER — PHENYLEPHRINE 40 MCG/ML (10ML) SYRINGE FOR IV PUSH (FOR BLOOD PRESSURE SUPPORT)
80.0000 ug | PREFILLED_SYRINGE | INTRAVENOUS | Status: DC | PRN
  Filled 2017-02-20: qty 5
  Filled 2017-02-20: qty 10

## 2017-02-20 MED ORDER — DIPHENHYDRAMINE HCL 25 MG PO CAPS: 25.0000 mg | ORAL_CAPSULE | Freq: Four times a day (QID) | ORAL | Status: DC | PRN

## 2017-02-20 MED ORDER — EPHEDRINE 5 MG/ML INJ
10.0000 mg | INTRAVENOUS | Status: DC | PRN
  Filled 2017-02-20: qty 2

## 2017-02-20 MED ORDER — DIBUCAINE 1 % RE OINT: 1.0000 "application " | TOPICAL_OINTMENT | RECTAL | Status: DC | PRN

## 2017-02-20 MED ORDER — ONDANSETRON HCL 4 MG PO TABS: 4.0000 mg | ORAL_TABLET | ORAL | Status: DC | PRN

## 2017-02-20 MED ORDER — ACETAMINOPHEN 325 MG PO TABS: 650.0000 mg | ORAL_TABLET | ORAL | Status: DC | PRN

## 2017-02-20 MED ORDER — ZOLPIDEM TARTRATE 5 MG PO TABS: 5.0000 mg | ORAL_TABLET | Freq: Every evening | ORAL | Status: DC | PRN

## 2017-02-20 MED ORDER — BENZOCAINE-MENTHOL 20-0.5 % EX AERO
1.0000 "application " | INHALATION_SPRAY | CUTANEOUS | Status: DC | PRN
  Administered 2017-02-20: 1 via TOPICAL
  Filled 2017-02-20: qty 56

## 2017-02-20 MED ORDER — SOD CITRATE-CITRIC ACID 500-334 MG/5ML PO SOLN: 30.0000 mL | ORAL | Status: DC | PRN

## 2017-02-20 MED ORDER — LACTATED RINGERS IV SOLN: 500.0000 mL | Freq: Once | INTRAVENOUS | Status: DC

## 2017-02-20 MED ORDER — LACTATED RINGERS IV SOLN
INTRAVENOUS | Status: DC
  Administered 2017-02-20: 125 mL via INTRAVENOUS

## 2017-02-20 MED ORDER — WITCH HAZEL-GLYCERIN EX PADS
1.0000 | MEDICATED_PAD | CUTANEOUS | Status: DC | PRN
Start: 2017-02-20 — End: 2017-02-21

## 2017-02-20 MED ORDER — OXYTOCIN BOLUS FROM INFUSION
500.0000 mL | Freq: Once | INTRAVENOUS | Status: AC
  Administered 2017-02-20: 500 mL via INTRAVENOUS

## 2017-02-20 MED ORDER — IBUPROFEN 600 MG PO TABS
600.0000 mg | ORAL_TABLET | Freq: Four times a day (QID) | ORAL | Status: DC
  Administered 2017-02-20 – 2017-02-21 (×5): 600 mg via ORAL
  Filled 2017-02-20 (×6): qty 1

## 2017-02-20 MED ORDER — SENNOSIDES-DOCUSATE SODIUM 8.6-50 MG PO TABS
2.0000 | ORAL_TABLET | ORAL | Status: DC
  Administered 2017-02-21: 2 via ORAL
  Filled 2017-02-20: qty 2

## 2017-02-20 MED ORDER — PRENATAL MULTIVITAMIN CH
1.0000 | ORAL_TABLET | Freq: Every day | ORAL | Status: DC
  Administered 2017-02-20 – 2017-02-21 (×2): 1 via ORAL
  Filled 2017-02-20 (×2): qty 1

## 2017-02-20 MED ORDER — PHENYLEPHRINE 40 MCG/ML (10ML) SYRINGE FOR IV PUSH (FOR BLOOD PRESSURE SUPPORT)
80.0000 ug | PREFILLED_SYRINGE | INTRAVENOUS | Status: DC | PRN
  Filled 2017-02-20: qty 10
  Filled 2017-02-20: qty 5

## 2017-02-20 MED ORDER — FLEET ENEMA 7-19 GM/118ML RE ENEM: 1.0000 | ENEMA | RECTAL | Status: DC | PRN

## 2017-02-20 MED ORDER — DIPHENHYDRAMINE HCL 50 MG/ML IJ SOLN: 12.5000 mg | INTRAMUSCULAR | Status: DC | PRN

## 2017-02-20 MED ORDER — BUTORPHANOL TARTRATE 2 MG/ML IJ SOLN: 2.0000 mg | INTRAMUSCULAR | Status: DC | PRN

## 2017-02-20 MED ORDER — LIDOCAINE HCL (PF) 1 % IJ SOLN
30.0000 mL | INTRAMUSCULAR | Status: DC | PRN
  Administered 2017-02-20: 30 mL via SUBCUTANEOUS
  Filled 2017-02-20: qty 30

## 2017-02-20 MED ORDER — FERROUS SULFATE 325 (65 FE) MG PO TABS
325.0000 mg | ORAL_TABLET | Freq: Two times a day (BID) | ORAL | Status: DC
  Administered 2017-02-20 – 2017-02-21 (×3): 325 mg via ORAL
  Filled 2017-02-20 (×3): qty 1

## 2017-02-20 MED ORDER — ONDANSETRON HCL 4 MG/2ML IJ SOLN
4.0000 mg | INTRAMUSCULAR | Status: DC | PRN
Start: 2017-02-20 — End: 2017-02-21

## 2017-02-20 MED ORDER — LACTATED RINGERS IV SOLN: 500.0000 mL | INTRAVENOUS | Status: DC | PRN

## 2017-02-20 MED ORDER — ONDANSETRON HCL 4 MG/2ML IJ SOLN: 4.0000 mg | Freq: Four times a day (QID) | INTRAMUSCULAR | Status: DC | PRN

## 2017-02-20 MED ORDER — FENTANYL 2.5 MCG/ML BUPIVACAINE 1/10 % EPIDURAL INFUSION (WH - ANES)
14.0000 mL/h | INTRAMUSCULAR | Status: DC | PRN
  Administered 2017-02-20: 14 mL/h via EPIDURAL
  Filled 2017-02-20: qty 100

## 2017-02-20 MED ORDER — SIMETHICONE 80 MG PO CHEW: 80.0000 mg | CHEWABLE_TABLET | ORAL | Status: DC | PRN

## 2017-02-20 MED ORDER — LIDOCAINE HCL (PF) 1 % IJ SOLN
INTRAMUSCULAR | Status: DC | PRN
  Administered 2017-02-20 (×2): 5 mL

## 2017-02-20 MED ORDER — OXYTOCIN 40 UNITS IN LACTATED RINGERS INFUSION - SIMPLE MED
2.5000 [IU]/h | INTRAVENOUS | Status: DC
  Administered 2017-02-20: 2.5 [IU]/h via INTRAVENOUS
  Filled 2017-02-20: qty 1000

## 2017-02-20 MED ORDER — OXYCODONE-ACETAMINOPHEN 5-325 MG PO TABS: 1.0000 | ORAL_TABLET | ORAL | Status: DC | PRN

## 2017-02-20 MED ORDER — OXYCODONE-ACETAMINOPHEN 5-325 MG PO TABS: 2.0000 | ORAL_TABLET | ORAL | Status: DC | PRN

## 2017-02-20 NOTE — H&P (Signed)
Margaret Schwartz is a 31 y.o. female presenting @ 39 5/7 weeks in active labor. Intact membrane. GBS cx neg  OB History    Gravida Para Term Preterm AB Living   3 1 1     1    SAB TAB Ectopic Multiple Live Births           1     Past Medical History:  Diagnosis Date  . Allergy   . Frequent headaches    Past Surgical History:  Procedure Laterality Date  . NASAL SINUS SURGERY  2007   turbinate reduction  . TONSILLECTOMY AND ADENOIDECTOMY  2007  . VAGINAL DELIVERY  2013   friable cervix with bleeding during pregnancy, anemia after pregnancy  . WISDOM TOOTH EXTRACTION     Family History: family history includes Alcohol abuse in her father; Arthritis in her maternal grandfather and maternal grandmother; Cancer in her maternal grandfather; Cancer (age of onset: 13) in her maternal uncle; Diabetes in her maternal grandmother; Drug abuse in her father; Heart disease in her maternal grandfather and mother; Hyperlipidemia in her mother; Hypertension in her maternal grandfather and mother; Stroke in her maternal grandfather. Social History:  reports that she has never smoked. She has never used smokeless tobacco. She reports that she does not drink alcohol or use drugs.     Maternal Diabetes: No Genetic Screening: Normal Maternal Ultrasounds/Referrals: Normal Fetal Ultrasounds or other Referrals:  None Maternal Substance Abuse:  No Significant Maternal Medications:  None Significant Maternal Lab Results:  Lab values include: Group B Strep negative Other Comments:  None  Review of Systems  All other systems reviewed and are negative.  History Dilation: 4 Effacement (%): 90 Station: -2 Exam by:: Hessie Diener, RN Blood pressure 134/82, pulse 100, temperature 97.9 F (36.6 C), temperature source Oral, height 5\' 3"  (1.6 m), weight 69.9 kg (154 lb), SpO2 100 %. Maternal Exam:  Uterine Assessment: Contraction strength is firm.  Abdomen: Patient reports no abdominal tenderness. Estimated fetal  weight is 7 1/2 lb.   Fetal presentation: vertex  Pelvis: adequate for delivery.      Physical Exam  Constitutional: She is oriented to person, place, and time. She appears well-developed and well-nourished.  HENT:  Head: Atraumatic.  Neck: Neck supple.  Cardiovascular: Normal rate and regular rhythm.   Respiratory: Breath sounds normal.  GI: Soft.  Musculoskeletal: Normal range of motion.  Neurological: She is alert and oriented to person, place, and time.  Skin: Skin is warm and dry.    Prenatal labs: ABO, Rh: --/--/AB POS (04/06 0050) Antibody: PENDING (04/06 0050) Rubella: Immune (09/01 0000) RPR: Nonreactive (09/01 0000)  HBsAg: Negative (09/01 0000)  HIV: Non-reactive (09/01 0000)  GBS: Negative (03/05 0000)   Assessment/Plan: Active labor Term gestation P) admit routine labs. Epidural.   Gwyndolyn Guilford A 02/20/2017, 1:29 AM

## 2017-02-20 NOTE — MAU Note (Signed)
Labs drawn, IV started, pt. Transferred to L&D via WC to room 165. Spouse at the [facility].

## 2017-02-20 NOTE — Anesthesia Procedure Notes (Signed)
Epidural Patient location during procedure: OB  Staffing Anesthesiologist: Montez Hageman Performed: anesthesiologist   Preanesthetic Checklist Completed: patient identified, site marked, surgical consent, pre-op evaluation, timeout performed, IV checked, risks and benefits discussed and monitors and equipment checked  Epidural Patient position: sitting Prep: DuraPrep Patient monitoring: heart rate, continuous pulse ox and blood pressure Approach: right paramedian Location: L3-L4 Injection technique: LOR saline  Needle:  Needle type: Tuohy  Needle gauge: 17 G Needle length: 9 cm and 9 Needle insertion depth: 5 cm Catheter type: closed end flexible Catheter size: 20 Guage Catheter at skin depth: 9 cm Test dose: negative  Assessment Events: blood not aspirated, injection not painful, no injection resistance, negative IV test and no paresthesia  Additional Notes Patient identified. Risks/Benefits/Options discussed with patient including but not limited to bleeding, infection, nerve damage, paralysis, failed block, incomplete pain control, headache, blood pressure changes, nausea, vomiting, reactions to medication both or allergic, itching and postpartum back pain. Confirmed with bedside nurse the patient's most recent platelet count. Confirmed with patient that they are not currently taking any anticoagulation, have any bleeding history or any family history of bleeding disorders. Patient expressed understanding and wished to proceed. All questions were answered. Sterile technique was used throughout the entire procedure. Please see nursing notes for vital signs. Test dose was given through epidural needle and negative prior to continuing to dose epidural or start infusion. Warning signs of high block given to the patient including shortness of breath, tingling/numbness in hands, complete motor block, or any concerning symptoms with instructions to call for help. Patient was given  instructions on fall risk and not to get out of bed. All questions and concerns addressed with instructions to call with any issues.

## 2017-02-20 NOTE — Anesthesia Postprocedure Evaluation (Signed)
Anesthesia Post Note  Patient: Margaret Schwartz  Procedure(s) Performed: * No procedures listed *  Patient location during evaluation: Mother Baby Anesthesia Type: Epidural Level of consciousness: awake and alert Pain management: satisfactory to patient Vital Signs Assessment: post-procedure vital signs reviewed and stable Respiratory status: respiratory function stable Cardiovascular status: stable Postop Assessment: no headache, no backache, epidural receding, patient able to bend at knees, no signs of nausea or vomiting and adequate PO intake Anesthetic complications: no        Last Vitals:  Vitals:   02/20/17 0335 02/20/17 0430  BP: 109/83 117/69  Pulse: 98 88  Resp: 18 18  Temp: 36.6 C 36.8 C    Last Pain:  Vitals:   02/20/17 0506  TempSrc:   PainSc: 0-No pain   Pain Goal:                 Margaret Schwartz

## 2017-02-20 NOTE — Lactation Note (Signed)
This note was copied from a baby's chart. Lactation Consultation Note  Patient Name: Margaret Schwartz LAGTX'M Date: 02/20/2017 Reason for consult: Initial assessment Breastfeeding consultation services and support information given and reviewed.  Mom is currently feeding baby in cradle hold.  Newborn is 31 hours old and has been cluster feeding.  Mom breastfed her first baby for 6 weeks and stopped because of severe reflux.  Reflux did not improve when baby was changed to formula and mom felt much frustration and discouragement.  She hopes to nurse this baby longer.  Reviewed basics and answered questions.  Instructed to feed with any cue using good breast massage.  Encouraged to call out for assist/concerns prn.  Maternal Data    Feeding Feeding Type: Breast Fed  LATCH Score/Interventions Latch: Grasps breast easily, tongue down, lips flanged, rhythmical sucking.  Audible Swallowing: A few with stimulation  Type of Nipple: Everted at rest and after stimulation  Comfort (Breast/Nipple): Filling, red/small blisters or bruises, mild/mod discomfort  Problem noted: Mild/Moderate discomfort  Hold (Positioning): No assistance needed to correctly position infant at breast.  LATCH Score: 8  Lactation Tools Discussed/Used     Consult Status Consult Status: Follow-up Date: 02/21/17 Follow-up type: In-patient    Ave Filter 02/20/2017, 2:26 PM

## 2017-02-20 NOTE — Anesthesia Preprocedure Evaluation (Signed)
Anesthesia Evaluation  Patient identified by MRN, date of birth, ID band Patient awake    Reviewed: Allergy & Precautions, H&P , NPO status , Patient's Chart, lab work & pertinent test results, reviewed documented beta blocker date and time   History of Anesthesia Complications Negative for: history of anesthetic complications  Airway Mallampati: II  TM Distance: >3 FB Neck ROM: full    Dental no notable dental hx. (+) Teeth Intact   Pulmonary neg pulmonary ROS,    Pulmonary exam normal breath sounds clear to auscultation       Cardiovascular Exercise Tolerance: Good negative cardio ROS Normal cardiovascular exam Rhythm:regular Rate:Normal     Neuro/Psych negative neurological ROS  negative psych ROS   GI/Hepatic negative GI ROS, Neg liver ROS,   Endo/Other  negative endocrine ROS  Renal/GU negative Renal ROS  negative genitourinary   Musculoskeletal   Abdominal   Peds  Hematology negative hematology ROS (+)   Anesthesia Other Findings   Reproductive/Obstetrics (+) Pregnancy                             Anesthesia Physical Anesthesia Plan  ASA: II  Anesthesia Plan: Epidural   Post-op Pain Management:    Induction:   Airway Management Planned:   Additional Equipment:   Intra-op Plan:   Post-operative Plan:   Informed Consent: I have reviewed the patients History and Physical, chart, labs and discussed the procedure including the risks, benefits and alternatives for the proposed anesthesia with the patient or authorized representative who has indicated his/her understanding and acceptance.   Dental Advisory Given  Plan Discussed with: CRNA  Anesthesia Plan Comments:         Anesthesia Quick Evaluation

## 2017-02-20 NOTE — MAU Note (Signed)
Pt. Is OK with taking Percocet.  RN verify with pharmacy.

## 2017-02-21 LAB — CBC
HCT: 31.9 % — ABNORMAL LOW (ref 36.0–46.0)
HEMOGLOBIN: 10.7 g/dL — AB (ref 12.0–15.0)
MCH: 30.4 pg (ref 26.0–34.0)
MCHC: 33.5 g/dL (ref 30.0–36.0)
MCV: 90.6 fL (ref 78.0–100.0)
PLATELETS: 232 10*3/uL (ref 150–400)
RBC: 3.52 MIL/uL — AB (ref 3.87–5.11)
RDW: 14.2 % (ref 11.5–15.5)
WBC: 11.7 10*3/uL — ABNORMAL HIGH (ref 4.0–10.5)

## 2017-02-21 MED ORDER — COCONUT OIL OIL: 1.0000 "application " | TOPICAL_OIL | 0 refills | Status: DC | PRN

## 2017-02-21 MED ORDER — BENZOCAINE-MENTHOL 20-0.5 % EX AERO: 1.0000 "application " | INHALATION_SPRAY | CUTANEOUS | Status: DC | PRN

## 2017-02-21 MED ORDER — DIBUCAINE 1 % RE OINT: 1.0000 "application " | TOPICAL_OINTMENT | RECTAL | 0 refills | Status: DC | PRN

## 2017-02-21 MED ORDER — IBUPROFEN 600 MG PO TABS: 600.0000 mg | ORAL_TABLET | Freq: Four times a day (QID) | ORAL | 0 refills | Status: DC

## 2017-02-21 NOTE — Discharge Summary (Signed)
Obstetric Discharge Summary Reason for Admission: onset of labor Prenatal Procedures: ultrasound Intrapartum Procedures: spontaneous vaginal delivery and epidural Postpartum Procedures: none Complications-Operative and Postpartum: 2nd degree perineal laceration Hemoglobin  Date Value Ref Range Status  02/21/2017 10.7 (L) 12.0 - 15.0 g/dL Final   HCT  Date Value Ref Range Status  02/21/2017 31.9 (L) 36.0 - 46.0 % Final    Physical Exam:  General: alert, cooperative and no distress Lochia: appropriate Uterine Fundus: firm Incision: healing well DVT Evaluation: No cords or calf tenderness.  Discharge Diagnoses: Term Pregnancy-delivered  Discharge Information: Date: 02/21/2017 Activity: unrestricted Diet: routine Medications: PNV and Ibuprofen Condition: stable Instructions: refer to practice specific booklet Discharge to: home Follow-up Information    COUSINS,SHERONETTE A, MD. Schedule an appointment as soon as possible for a visit in 6 week(s).   Specialty:  Obstetrics and Gynecology Contact information: [address] [place] [address] [phone]           Newborn Data: Live born female Ellard Artis Birth Weight: 7 lb 12.7 oz (3535 g) APGAR: 9, 9  Home with mother.  Juliene Pina, CNM 02/21/2017, 12:17 PM

## 2017-02-21 NOTE — Progress Notes (Signed)
PPD # 1 SVD Information for the patient's newborn:  Thomas, Rhude Girl Monicia [id number]  female    breast feeding  Baby name: Ellard Artis  S:  Reports feeling well, desires DC home today.             Tolerating po/ No nausea or vomiting             Bleeding is decreased.             Pain controlled with ibuprofen (OTC)             Up ad lib / ambulatory / voiding without difficulties        O:  A & O x 3, in no apparent distress              VS:  Vitals:   02/20/17 0430 02/20/17 0850 02/20/17 1846 02/21/17 0500  BP: 117/69 127/73 123/78 108/67  Pulse: 88 80 98 90  Resp: 18 20 18 18   Temp: 98.2 F (36.8 C) 98.2 F (36.8 C) 98 F (36.7 C) 98 F (36.7 C)  TempSrc: Oral Oral Oral Oral  SpO2: 99%   99%  Weight:      Height:        LABS:  Recent Labs  02/20/17 0050 02/21/17 0522  WBC 14.3* 11.7*  HGB 11.9* 10.7*  HCT 35.6* 31.9*  PLT 261 232    Blood type: --/--/AB POS, AB POS (04/06 0050)  Rubella: Immune (09/01 0000)   I&O: I/O last 3 completed shifts: In: -  Out: 250 [Blood:250]          No intake/output data recorded.  Lungs: Clear and unlabored  Heart: regular rate and rhythm / no murmurs  Abdomen: soft, non-tender, non-distended             Fundus: below U  Perineum: deferred  Extremities: no edema, no calf pain or tenderness    A/P: PPD # 1 31 y.o., [id number]   Principal Problem:   Postpartum care following vaginal delivery 4/6 Active Problems:   Second-degree perineal laceration, with delivery   Doing well - stable status  Routine post partum orders             DC home today w/ instructions  F/U at [place] in 6 weeks and PRN     Juliene Pina, MSN, CNM 02/21/2017, 12:04 PM

## 2017-02-21 NOTE — Lactation Note (Signed)
This note was copied from a baby's chart. Lactation Consultation Note  Patient Name: Margaret Schwartz ZSMOL'M Date: 02/21/2017 Reason for consult: Follow-up assessment Mom reports baby is nursing well, denies questions/concerns. Engorgement care discussed. Advised of OP services and support group.  Maternal Data    Feeding Length of feed: 20 min  LATCH Score/Interventions Latch: Grasps breast easily, tongue down, lips flanged, rhythmical sucking.  Audible Swallowing: Spontaneous and intermittent  Type of Nipple: Everted at rest and after stimulation  Comfort (Breast/Nipple): Filling, red/small blisters or bruises, mild/mod discomfort  Problem noted: Mild/Moderate discomfort Interventions (Mild/moderate discomfort):  (EBM, coconut oil instead of lanolin)  Hold (Positioning): No assistance needed to correctly position infant at breast.  LATCH Score: 9  Lactation Tools Discussed/Used     Consult Status Consult Status: Complete Date: 02/21/17 Follow-up type: In-patient    Katrine Coho 02/21/2017, 12:17 PM

## 2017-04-16 DIAGNOSIS — Z1151 Encounter for screening for human papillomavirus (HPV): Secondary | ICD-10-CM | POA: Diagnosis not present

## 2017-04-16 DIAGNOSIS — Z130 Encounter for screening for diseases of the blood and blood-forming organs and certain disorders involving the immune mechanism: Secondary | ICD-10-CM | POA: Diagnosis not present

## 2017-09-24 ENCOUNTER — Telehealth: Payer: 59 | Admitting: Physician Assistant

## 2017-09-24 DIAGNOSIS — R102 Pelvic and perineal pain: Secondary | ICD-10-CM

## 2017-09-24 NOTE — Progress Notes (Signed)
For the safety of you and your child, I recommend a face to face office visit with a health care provider.  Many mothers need to take medicines during their pregnancy and while nursing.  Almost all medicines pass into the breast milk in small quantities.  Most are generally considered safe for a mother to take but some medicines must be avoided.  After reviewing your E-Visit request, I recommend that you consult your OB/GYN or pediatrician for medical advice in relation to your condition and prescription medications while pregnant or breastfeeding.  If you are having a true medical emergency please call 911.  If you need an urgent face to face visit, [facility] has four urgent care centers for your convenience.  If you need care fast and have a high deductible or no insurance consider:   [email or web address]  [phone]  [address] [place], [place] [address] 8 am to 8 pm Monday-Friday 10 am to 4 pm Saturday-Sunday   The following sites will take your  insurance:    . [facility] [place] a Provider at this Location  [address] [place], [place] [address] . 10 am to 8 pm Monday-Friday . 12 pm to 8 pm Saturday-Sunday   . [facility] at [place] a Provider at this Location  [place], [place], [place] [address] . 8 am to 8 pm Monday-Friday . 9 am to 6 pm Saturday . 11 am to 6 pm Sunday   . [facility] at [place] Get Driving Directions  [address] [place], [place] [address] . 8 am to 8 pm Monday-Friday . 8 am to 4 pm Saturday-Sunday   Your e-visit answers were reviewed by a board certified advanced clinical practitioner to complete your personal care plan.  Thank you for using e-Visits.

## 2017-12-09 ENCOUNTER — Encounter: Payer: Self-pay | Admitting: Family Medicine

## 2017-12-10 ENCOUNTER — Encounter: Payer: Self-pay | Admitting: Family Medicine

## 2017-12-10 NOTE — Telephone Encounter (Signed)
Copied from [place]. Topic: Referral - Request >> Dec 10, 2017  8:02 AM Robina Ade, Helene Kelp D wrote: Reason for CRM: Patient called and said that they would like a referral to see [facility]. Patient is seeing spots after a small accident. She would like to see, please call patient back, thanks. Shona Simpson, MD  [facility]  [place]   Venetia Maxon, MD  [facility]  [address] [place], [place]

## 2018-01-01 ENCOUNTER — Telehealth: Payer: No Typology Code available for payment source | Admitting: Family

## 2018-01-01 ENCOUNTER — Telehealth: Payer: Self-pay | Admitting: Family Medicine

## 2018-01-01 DIAGNOSIS — B373 Candidiasis of vulva and vagina: Secondary | ICD-10-CM

## 2018-01-01 DIAGNOSIS — B3731 Acute candidiasis of vulva and vagina: Secondary | ICD-10-CM

## 2018-01-01 MED ORDER — FLUCONAZOLE 150 MG PO TABS: 150.0000 mg | ORAL_TABLET | ORAL | 0 refills | Status: DC

## 2018-01-01 NOTE — Telephone Encounter (Signed)
She can be scheduled with me

## 2018-01-01 NOTE — Telephone Encounter (Signed)
OK with me.

## 2018-01-01 NOTE — Telephone Encounter (Signed)
Please advise   Copied from [place] [id number]. Topic: Appointment Scheduling - Prior Auth Required for Appointment >> Jan 01, 2018  9:32 AM Robina Ade, Helene Kelp D wrote: No appointment has been scheduled. Patient is requesting transfer appointment from [place] to [place]. Per scheduling protocol, this appointment requires a prior authorization prior to scheduling.  Route to department's PEC pool.

## 2018-01-01 NOTE — Progress Notes (Signed)
Thank you for the details you included in the comment boxes. Those details are very helpful in determining the best course of treatment for you and help [place] to provide the best care. The standard for treatment-resistant cases like yours would be 150mg  every 72 hours, 3 times. The suppository would likely not provide an advantage as the gel is similar in delivery to that and the evidence shows that the gel works as well and sometimes better than the oral (but this was not the case for you when you used it). Let's try the 3 dose approach as it is usually very effective.  We are sorry that you are not feeling well. Here is how we plan to help! Based on what you shared with me it looks like you: May have a yeast vaginosis  Vaginosis is an inflammation of the vagina that can result in discharge, itching and pain. The cause is usually a change in the normal balance of vaginal bacteria or an infection. Vaginosis can also result from reduced estrogen levels after menopause.  The most common causes of vaginosis are:   Bacterial vaginosis which results from an overgrowth of one on several organisms that are normally present in your vagina.   Yeast infections which are caused by a naturally occurring fungus called candida.   Vaginal atrophy (atrophic vaginosis) which results from the thinning of the vagina from reduced estrogen levels after menopause.   Trichomoniasis which is caused by a parasite and is commonly transmitted by sexual intercourse.  Factors that increase your risk of developing vaginosis include: Marland Kitchen Medications, such as antibiotics and steroids . Uncontrolled diabetes . Use of hygiene products such as bubble bath, vaginal spray or vaginal deodorant . Douching . Wearing damp or tight-fitting clothing . Using an intrauterine device (IUD) for birth control . Hormonal changes, such as those associated with pregnancy, birth control pills or menopause . Sexual activity . Having a sexually  transmitted infection  Your treatment plan is Diflucan 150mg , every 72 hours, 3 times. Friday, Monday, Thursday.   Be sure to take all of the medication as directed. Stop taking any medication if you develop a rash, tongue swelling or shortness of breath. Mothers who are breast feeding should consider pumping and discarding their breast milk while on these antibiotics. However, there is no consensus that infant exposure at these doses would be harmful.  Remember that medication creams can weaken latex condoms. Marland Kitchen   HOME CARE:  Good hygiene may prevent some types of vaginosis from recurring and may relieve some symptoms:  . Avoid baths, hot tubs and whirlpool spas. Rinse soap from your outer genital area after a shower, and dry the area well to prevent irritation. Don't use scented or harsh soaps, such as those with deodorant or antibacterial action. Marland Kitchen Avoid irritants. These include scented tampons and pads. . Wipe from front to back after using the toilet. Doing so avoids spreading fecal bacteria to your vagina.  Other things that may help prevent vaginosis include:  Marland Kitchen Don't douche. Your vagina doesn't require cleansing other than normal bathing. Repetitive douching disrupts the normal organisms that reside in the vagina and can actually increase your risk of vaginal infection. Douching won't clear up a vaginal infection. . Use a latex condom. Both female and female latex condoms may help you avoid infections spread by sexual contact. . Wear cotton underwear. Also wear pantyhose with a cotton crotch. If you feel comfortable without it, skip wearing underwear to bed. Yeast thrives in  moist environments Your symptoms should improve in the next day or two.  GET HELP RIGHT AWAY IF:  . You have pain in your lower abdomen ( pelvic area or over your ovaries) . You develop nausea or vomiting . You develop a fever . Your discharge changes or worsens . You have persistent pain with  intercourse . You develop shortness of breath, a rapid pulse, or you faint.  These symptoms could be signs of problems or infections that need to be evaluated by a medical provider now.  MAKE SURE YOU    Understand these instructions.  Will watch your condition.  Will get help right away if you are not doing well or get worse.  Your e-visit answers were reviewed by a board certified advanced clinical practitioner to complete your personal care plan. Depending upon the condition, your plan could have included both over the counter or prescription medications. Please review your pharmacy choice to make sure that you have choses a pharmacy that is open for you to pick up any needed prescription, Your safety is important to [place]. If you have drug allergies check your prescription carefully.   You can use MyChart to ask questions about today's visit, request a non-urgent call back, or ask for a work or school excuse for 24 hours related to this e-Visit. If it has been greater than 24 hours you will need to follow up with your provider, or enter a new e-Visit to address those concerns. You will get a MyChart message within the next two days asking about your experience. I hope that your e-visit has been valuable and will speed your recovery.

## 2018-01-01 NOTE — Telephone Encounter (Signed)
This is fine with me.  I have not seen the patient before.

## 2018-01-11 NOTE — Telephone Encounter (Signed)
I called and spoke with pt. She will call to set up appt to est care/ poss CPE at another time.

## 2018-03-09 ENCOUNTER — Encounter: Payer: Self-pay | Admitting: Family Medicine

## 2018-03-09 ENCOUNTER — Ambulatory Visit (INDEPENDENT_AMBULATORY_CARE_PROVIDER_SITE_OTHER): Payer: No Typology Code available for payment source | Admitting: Family Medicine

## 2018-03-09 VITALS — BP 112/66 | HR 85 | Temp 99.4°F | Ht 63.0 in | Wt 115.5 lb

## 2018-03-09 DIAGNOSIS — J0100 Acute maxillary sinusitis, unspecified: Secondary | ICD-10-CM

## 2018-03-09 DIAGNOSIS — J019 Acute sinusitis, unspecified: Secondary | ICD-10-CM | POA: Insufficient documentation

## 2018-03-09 MED ORDER — FLUCONAZOLE 150 MG PO TABS: 150.0000 mg | ORAL_TABLET | Freq: Once | ORAL | 0 refills | Status: AC

## 2018-03-09 MED ORDER — AMOXICILLIN-POT CLAVULANATE 875-125 MG PO TABS: 1.0000 | ORAL_TABLET | Freq: Two times a day (BID) | ORAL | 0 refills | Status: DC

## 2018-03-09 NOTE — Patient Instructions (Signed)
You have a sinus infection  Drink lots of water /fluids  Warm compresses on sinuses  Nasal saline/steam for congestion  Ibuprofen as needed   Take the augmentin as directed  Diflucan for yeast if needed

## 2018-03-09 NOTE — Progress Notes (Signed)
Subjective:    Patient ID: Margaret Schwartz, female    DOB: 09/03/1986, 32 y.o.   MRN: [id number]  HPI Here for uri symptoms   2 kids at home were sick  Husband may have had the flu   Headache for over 10 days-sinus pain (taking ibuprofen three times daily)  Extremely tired    Sinus congestion /nasal congestion  Nose burns Ears hurt  Feverish a few days ago  99.6 at home  Green bloody nasal d/c  Cough - was dry/now more productive and deep/ not a lot of phlegm   She is breast feeding currently  Daughter bit her when breastfeeding  Has a tiny cut  Using neosporin    Patient Active Problem List   Diagnosis Date Noted  . Acute sinusitis 03/09/2018  . Second-degree perineal laceration, with delivery 02/21/2017  . Postpartum care following vaginal delivery 4/6 02/20/2017  . 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 Menorrhagia with regular cycle 07/11/2014  . Hair loss 07/11/2014   Past Medical History:  Diagnosis Date  . Allergy   . Frequent headaches    Past Surgical History:  Procedure Laterality Date  . NASAL SINUS SURGERY  2007   turbinate reduction  . TONSILLECTOMY AND ADENOIDECTOMY  2007  . VAGINAL DELIVERY  2013   friable cervix with bleeding during pregnancy, anemia after pregnancy  . WISDOM TOOTH EXTRACTION     Social History   Tobacco Use  . Smoking status: Never Smoker  . Smokeless tobacco: Never Used  Substance Use Topics  . Alcohol use: No  . Drug use: No   Family History  Problem Relation Age of Onset  . Heart disease Mother   . Hyperlipidemia Mother   . Hypertension Mother   . Alcohol abuse Father   . Drug abuse Father   . Arthritis Maternal Grandmother   . Diabetes Maternal Grandmother   . Arthritis Maternal Grandfather   . Cancer Maternal Grandfather        Lung cancer  . Heart disease Maternal Grandfather   . Stroke Maternal Grandfather   . Hypertension Maternal Grandfather   . Cancer Maternal Uncle  48       NHL   Allergies  Allergen Reactions  . Vicodin [Hydrocodone-Acetaminophen] Nausea And Vomiting   Current Outpatient Medications on File Prior to Visit  Medication Sig Dispense Refill  . coconut oil OIL Apply 1 application topically as needed.  0   No current facility-administered medications on file prior to visit.     Review of Systems  Constitutional: Positive for appetite change. Negative for fatigue and fever.       Low grade temp  HENT: Positive for congestion, ear pain, postnasal drip, rhinorrhea, sinus pressure and sore throat. Negative for nosebleeds.   Eyes: Negative for pain, redness and itching.  Respiratory: Positive for cough. Negative for shortness of breath and wheezing.   Cardiovascular: Negative for chest pain.  Gastrointestinal: Negative for abdominal pain, diarrhea, nausea and vomiting.  Endocrine: Negative for polyuria.  Genitourinary: Negative for dysuria, frequency and urgency.  Musculoskeletal: Negative for arthralgias and myalgias.  Allergic/Immunologic: Negative for immunocompromised state.  Neurological: Positive for headaches. Negative for dizziness, tremors, syncope, weakness and numbness.  Hematological: Negative for adenopathy. Does not bruise/bleed easily.  Psychiatric/Behavioral: Negative for dysphoric mood. The patient is not nervous/anxious.        Objective:   Physical Exam  Constitutional: She appears well-developed and well-nourished. No distress.  Appears fatigued  but well  HENT:  Head: Normocephalic and atraumatic.  Right Ear: External ear normal.  Left Ear: External ear normal.  Mouth/Throat: Oropharynx is clear and moist. No oropharyngeal exudate.  Nares are injected and congested  Bilateral maxillary and frontal sinus tenderness  Post nasal drip   Eyes: Pupils are equal, round, and reactive to light. Conjunctivae and EOM are normal. Right eye exhibits no discharge. Left eye exhibits no discharge.  Neck: Normal range of  motion. Neck supple.  Cardiovascular: Normal rate and regular rhythm.  Pulmonary/Chest: Effort normal and breath sounds normal. No respiratory distress. She has no wheezes. She has no rales.  Good air exch No wheeze  Lymphadenopathy:    She has no cervical adenopathy.  Neurological: She is alert. No cranial nerve deficit.  Skin: Skin is warm and dry. No rash noted.  Psychiatric: She has a normal mood and affect.          Assessment & Plan:   Problem List Items Addressed This Visit      Respiratory   Acute sinusitis - Primary    In breastfeeding female  S/p uri  tx with augmentin (prone to vaginal yeast inf so also diflucan prn) Fluids/rest/sympt care  Disc symptomatic care - see instructions on AVS  Update if not starting to improve in a week or if worsening        Relevant Medications   amoxicillin-clavulanate (AUGMENTIN) 875-125 MG tablet

## 2018-03-10 NOTE — Assessment & Plan Note (Addendum)
In breastfeeding female  S/p uri  tx with augmentin (prone to vaginal yeast inf so also diflucan prn) Fluids/rest/sympt care  Disc symptomatic care - see instructions on AVS  Update if not starting to improve in a week or if worsening

## 2018-03-31 ENCOUNTER — Other Ambulatory Visit (INDEPENDENT_AMBULATORY_CARE_PROVIDER_SITE_OTHER): Payer: No Typology Code available for payment source

## 2018-03-31 ENCOUNTER — Telehealth: Payer: Self-pay | Admitting: Family Medicine

## 2018-03-31 DIAGNOSIS — Z0000 Encounter for general adult medical examination without abnormal findings: Secondary | ICD-10-CM

## 2018-03-31 LAB — COMPREHENSIVE METABOLIC PANEL
ALT: 10 U/L (ref 0–35)
AST: 10 U/L (ref 0–37)
Albumin: 4.2 g/dL (ref 3.5–5.2)
Alkaline Phosphatase: 69 U/L (ref 39–117)
BUN: 9 mg/dL (ref 6–23)
CO2: 29 mEq/L (ref 19–32)
CREATININE: 0.53 mg/dL (ref 0.40–1.20)
Calcium: 9.3 mg/dL (ref 8.4–10.5)
Chloride: 104 mEq/L (ref 96–112)
GFR: 140.22 mL/min (ref >60.00)
GLUCOSE: 88 mg/dL (ref 70–99)
Potassium: 4.2 mEq/L (ref 3.5–5.1)
SODIUM: 139 meq/L (ref 135–145)
TOTAL PROTEIN: 7 g/dL (ref 6.0–8.3)
Total Bilirubin: 0.4 mg/dL (ref 0.2–1.2)

## 2018-03-31 LAB — LIPID PANEL
CHOL/HDL RATIO: 2
Cholesterol: 127 mg/dL (ref 0–200)
HDL: 57.4 mg/dL (ref >39.00)
LDL CALC: 63 mg/dL (ref 0–99)
NONHDL: 69.91
Triglycerides: 36 mg/dL (ref 0.0–149.0)
VLDL: 7.2 mg/dL (ref 0.0–40.0)

## 2018-03-31 LAB — CBC WITH DIFFERENTIAL/PLATELET
BASOS ABS: 0 10*3/uL (ref 0.0–0.1)
Basophils Relative: 0.6 % (ref 0.0–3.0)
EOS ABS: 0.1 10*3/uL (ref 0.0–0.7)
Eosinophils Relative: 2.6 % (ref 0.0–5.0)
HCT: 38.8 % (ref 36.0–46.0)
HEMOGLOBIN: 13.1 g/dL (ref 12.0–15.0)
Lymphocytes Relative: 30.9 % (ref 12.0–46.0)
Lymphs Abs: 1.7 10*3/uL (ref 0.7–4.0)
MCHC: 33.7 g/dL (ref 30.0–36.0)
MCV: 86.7 fl (ref 78.0–100.0)
MONO ABS: 0.5 10*3/uL (ref 0.1–1.0)
Monocytes Relative: 8.4 % (ref 3.0–12.0)
NEUTROS PCT: 57.5 % (ref 43.0–77.0)
Neutro Abs: 3.2 10*3/uL (ref 1.4–7.7)
Platelets: 320 10*3/uL (ref 150.0–400.0)
RBC: 4.47 Mil/uL (ref 3.87–5.11)
RDW: 12.7 % (ref 11.5–15.5)
WBC: 5.6 10*3/uL (ref 4.0–10.5)

## 2018-03-31 LAB — TSH: TSH: 1.78 u[IU]/mL (ref 0.35–4.50)

## 2018-03-31 NOTE — Telephone Encounter (Signed)
-----   Message from Ellamae Sia sent at 03/30/2018  2:40 PM EDT ----- Regarding: Lab orders for Wednesday, 5.15.19 Patient is scheduled for CPX labs, please order future labs, Thanks , Karna Christmas

## 2018-04-02 ENCOUNTER — Encounter: Payer: Self-pay | Admitting: Family Medicine

## 2018-04-02 ENCOUNTER — Ambulatory Visit (INDEPENDENT_AMBULATORY_CARE_PROVIDER_SITE_OTHER): Payer: No Typology Code available for payment source | Admitting: Family Medicine

## 2018-04-02 VITALS — BP 106/62 | HR 76 | Temp 98.1°F | Ht 62.0 in | Wt 112.2 lb

## 2018-04-02 DIAGNOSIS — E7210 Disorders of sulfur-bearing amino-acid metabolism, unspecified: Secondary | ICD-10-CM | POA: Diagnosis not present

## 2018-04-02 DIAGNOSIS — E559 Vitamin D deficiency, unspecified: Secondary | ICD-10-CM

## 2018-04-02 DIAGNOSIS — Z0000 Encounter for general adult medical examination without abnormal findings: Secondary | ICD-10-CM

## 2018-04-02 NOTE — Progress Notes (Signed)
Subjective:    Patient ID: Margaret Schwartz, female    DOB: June 19, 1986, 33 y.o.   MRN: [id number]  HPI Here for health maintenance exam and to review chronic medical problems    Doing well overall   Is also re establishing care   Would like to check vit D level  Has had a foot stress fracture in the past - it took quite a while to heal    24.5 level in 2015  Wants to have that re checked  Not taking vit D  Will get started on it now   Exercise -daily activity and walking occ pilates   Has 2 kids - very busy and active  6 and 13 mo old    Wt Readings from Last 3 Encounters:  04/02/18 112 lb 4 oz (50.9 kg)  03/09/18 115 lb 8 oz (52.4 kg)  02/20/17 154 lb (69.9 kg)  eating enough - today has been busy-not enough  Has to keep up with regular meals with schedule  20.53 kg/m   Flu vaccine - did not get one this season (she forgot this year) - 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   Pap 4/18  Goes to gyn yearly- goes soon- Dr Garwin Brothers  Menses are not problematic  Uses spermicide or condom  She may want to get pregnant again    Self breast exam - no lumps   Tetanus shot 2/18   hiv screen neg 06-Aug-2023   Family hx : father died in June 17, 2023 - suddenly (alcoholic) -found in his car  Post mortem - no specific cause found  He was in poor health / maj depression and a hoarder  Mother- HTN and high cholesterol , b9 colon tumor  Labs: Results for orders placed or performed in visit on 03/31/18  TSH  Result Value Ref Range   TSH 1.78 0.35 - 4.50 uIU/mL  Lipid panel  Result Value Ref Range   Cholesterol 127 0 - 200 mg/dL   Triglycerides 36.0 0.0 - 149.0 mg/dL   HDL 57.40 >39.00 mg/dL   VLDL 7.2 0.0 - 40.0 mg/dL   LDL Cholesterol 63 0 - 99 mg/dL   Total CHOL/HDL Ratio 2    NonHDL 69.91   CBC with Differential/Platelet  Result Value Ref Range   WBC 5.6 4.0 - 10.5 K/uL   RBC 4.47 3.87 - 5.11 Mil/uL   Hemoglobin 13.1 12.0 - 15.0 g/dL   HCT 38.8 36.0 - 46.0 %   MCV 86.7 78.0 -  100.0 fl   MCHC 33.7 30.0 - 36.0 g/dL   RDW 12.7 11.5 - 15.5 %   Platelets 320.0 150.0 - 400.0 K/uL   Neutrophils Relative % 57.5 43.0 - 77.0 %   Lymphocytes Relative 30.9 12.0 - 46.0 %   Monocytes Relative 8.4 3.0 - 12.0 %   Eosinophils Relative 2.6 0.0 - 5.0 %   Basophils Relative 0.6 0.0 - 3.0 %   Neutro Abs 3.2 1.4 - 7.7 K/uL   Lymphs Abs 1.7 0.7 - 4.0 K/uL   Monocytes Absolute 0.5 0.1 - 1.0 K/uL   Eosinophils Absolute 0.1 0.0 - 0.7 K/uL   Basophils Absolute 0.0 0.0 - 0.1 K/uL  Comprehensive metabolic panel  Result Value Ref Range   Sodium 139 135 - 145 mEq/L   Potassium 4.2 3.5 - 5.1 mEq/L   Chloride 104 96 - 112 mEq/L   CO2 29 19 - 32 mEq/L   Glucose, Bld 88 70 - 99 mg/dL  BUN 9 6 - 23 mg/dL   Creatinine, Ser 0.53 0.40 - 1.20 mg/dL   Total Bilirubin 0.4 0.2 - 1.2 mg/dL   Alkaline Phosphatase 69 39 - 117 U/L   AST 10 0 - 37 U/L   ALT 10 0 - 35 U/L   Total Protein 7.0 6.0 - 8.3 g/dL   Albumin 4.2 3.5 - 5.2 g/dL   Calcium 9.3 8.4 - 10.5 mg/dL   GFR 140.22 >60.00 mL/min    Diet is very good    Patient Active Problem List   Diagnosis Date Noted  . Vitamin D deficiency 04/02/2018  . Second-degree perineal laceration, with delivery 02/21/2017  . Routine general medical examination at a health care facility 07/17/2015  . 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 Allergy   . Frequent headaches    Past Surgical History:  Procedure Laterality Date  . NASAL SINUS SURGERY  2007   turbinate reduction  . TONSILLECTOMY AND ADENOIDECTOMY  2007  . VAGINAL DELIVERY  2013   friable cervix with bleeding during pregnancy, anemia after pregnancy  . WISDOM TOOTH EXTRACTION     Social History   Tobacco Use  . Smoking status: Never Smoker  . Smokeless tobacco: Never Used  Substance Use Topics  . Alcohol use: No  . Drug use: No   Family History  Problem Relation Age of Onset  . Heart disease Mother   .  Hyperlipidemia Mother   . Hypertension Mother   . Alcohol abuse Father   . Drug abuse Father   . Cancer Maternal Uncle 48       NHL  . Arthritis Maternal Grandmother   . Diabetes Maternal Grandmother   . Arthritis Maternal Grandfather   . Cancer Maternal Grandfather        Lung cancer  . Heart disease Maternal Grandfather   . Stroke Maternal Grandfather   . Hypertension Maternal Grandfather    Allergies  Allergen Reactions  . Vicodin [Hydrocodone-Acetaminophen] Nausea And Vomiting   Current Outpatient Medications on File Prior to Visit  Medication Sig Dispense Refill  . coconut oil OIL Apply 1 application topically as needed.  0   No current facility-administered medications on file prior to visit.     Review of Systems  Constitutional: Negative for activity change, appetite change, fatigue, fever and unexpected weight change.  HENT: Negative for congestion, ear pain, rhinorrhea, sinus pressure and sore throat.   Eyes: Negative for pain, redness and visual disturbance.  Respiratory: Negative for cough, shortness of breath and wheezing.   Cardiovascular: Negative for chest pain and palpitations.  Gastrointestinal: Negative for abdominal pain, blood in stool, constipation and diarrhea.  Endocrine: Negative for polydipsia and polyuria.  Genitourinary: Negative for dysuria, frequency and urgency.  Musculoskeletal: Negative for arthralgias, back pain and myalgias.  Skin: Negative for pallor and rash.  Allergic/Immunologic: Negative for environmental allergies.  Neurological: Negative for dizziness, syncope and headaches.  Hematological: Negative for adenopathy. Does not bruise/bleed easily.  Psychiatric/Behavioral: Negative for decreased concentration and dysphoric mood. The patient is not nervous/anxious.        Objective:   Physical Exam  Constitutional: She appears well-developed and well-nourished. No distress.  Slim and well appearing   HENT:  Head: Normocephalic and  atraumatic.  Right Ear: External ear normal.  Left Ear: External ear normal.  Nose: Nose normal.  Mouth/Throat: Oropharynx is clear and moist.  Eyes: Pupils are equal, round, and  reactive to light. Conjunctivae and EOM are normal. Right eye exhibits no discharge. Left eye exhibits no discharge. No scleral icterus.  Neck: Normal range of motion. Neck supple. No JVD present. Carotid bruit is not present. No thyromegaly present.  Cardiovascular: Normal rate, regular rhythm, normal heart sounds and intact distal pulses. Exam reveals no gallop.  Pulmonary/Chest: Effort normal and breath sounds normal. No respiratory distress. She has no wheezes. She has no rales.  Abdominal: Soft. Bowel sounds are normal. She exhibits no distension and no mass. There is no tenderness.  Musculoskeletal: She exhibits no edema or tenderness.  No kyphosis   Lymphadenopathy:    She has no cervical adenopathy.  Neurological: She is alert. She has normal reflexes. No cranial nerve deficit. She exhibits normal muscle tone. Coordination normal.  Skin: Skin is warm and dry. No rash noted. No erythema. No pallor.  Fair complexion  Scattered lentigines   Psychiatric: She has a normal mood and affect.  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[place])    NTHFR mutation  Found by gyn  Saw hematology at [facility]       Routine general medical examination at a health care facility - Primary    Reviewed health habits including diet and exercise and skin cancer prevention Reviewed appropriate screening tests for age  Also reviewed health mt list, fam hx and immunization status , as well as social and family history   Disc vitamin D - low in the past/ start 4000 iu daily  Inc exercisen when able  Enc yearly flu shot  Continue gyn care with Dr Garwin Brothers and self breast exams  Labs reviewed         Vitamin D deficiency      Not taking D  Will begin 4000 iu daily  Re check 1 y  Hx of stress fx  She will disc getting a dexa with her gyn

## 2018-04-02 NOTE — Assessment & Plan Note (Signed)
NTHFR mutation  Found by gyn  Saw hematology at [facility]

## 2018-04-02 NOTE — Patient Instructions (Addendum)
Get vitamin D3 over the counter and take 4000 iu daily   Ask your gyn about a bone density test in the future   Keep up good diet and health habits

## 2018-04-04 NOTE — Assessment & Plan Note (Signed)
Not taking D  Will begin 4000 iu daily  Re check 1 y  Hx of stress fx  She will disc getting a dexa with her gyn

## 2018-04-04 NOTE — Assessment & Plan Note (Signed)
Reviewed health habits including diet and exercise and skin cancer prevention Reviewed appropriate screening tests for age  Also reviewed health mt list, fam hx and immunization status , as well as social and family history   Disc vitamin D - low in the past/ start 4000 iu daily  Inc exercisen when able  Enc yearly flu shot  Continue gyn care with Dr Garwin Brothers and self breast exams  Labs reviewed

## 2018-10-05 ENCOUNTER — Telehealth: Payer: No Typology Code available for payment source | Admitting: Physician Assistant

## 2018-10-05 DIAGNOSIS — B9689 Other specified bacterial agents as the cause of diseases classified elsewhere: Secondary | ICD-10-CM

## 2018-10-05 DIAGNOSIS — J329 Chronic sinusitis, unspecified: Secondary | ICD-10-CM

## 2018-10-05 MED ORDER — AMOXICILLIN-POT CLAVULANATE 875-125 MG PO TABS: 1.0000 | ORAL_TABLET | Freq: Two times a day (BID) | ORAL | 0 refills | Status: AC

## 2018-10-05 NOTE — Progress Notes (Signed)

## 2018-10-06 MED ORDER — FLUCONAZOLE 150 MG PO TABS: 150.0000 mg | ORAL_TABLET | Freq: Once | ORAL | 0 refills | Status: AC

## 2018-10-06 NOTE — Addendum Note (Signed)
Addended by: Chevis Pretty on: 10/06/2018 02:34 PM   Modules accepted: Orders

## 2019-01-17 ENCOUNTER — Encounter: Payer: Self-pay | Admitting: Emergency Medicine

## 2019-01-17 ENCOUNTER — Encounter: Payer: Self-pay | Admitting: Family Medicine

## 2019-01-17 ENCOUNTER — Emergency Department: Payer: No Typology Code available for payment source

## 2019-01-17 ENCOUNTER — Ambulatory Visit (INDEPENDENT_AMBULATORY_CARE_PROVIDER_SITE_OTHER): Payer: No Typology Code available for payment source | Admitting: Family Medicine

## 2019-01-17 ENCOUNTER — Telehealth: Payer: Self-pay

## 2019-01-17 ENCOUNTER — Emergency Department
Admission: EM | Admit: 2019-01-17 | Discharge: 2019-01-17 | Disposition: A | Discharge status: Home / Self Care | Payer: No Typology Code available for payment source | Attending: Emergency Medicine | Admitting: Emergency Medicine

## 2019-01-17 VITALS — BP 128/90 | HR 89 | Temp 98.5°F | Resp 14 | Ht 62.0 in | Wt 115.8 lb

## 2019-01-17 DIAGNOSIS — I7300 Raynaud's syndrome without gangrene: Secondary | ICD-10-CM

## 2019-01-17 DIAGNOSIS — R079 Chest pain, unspecified: Secondary | ICD-10-CM | POA: Diagnosis not present

## 2019-01-17 DIAGNOSIS — R9431 Abnormal electrocardiogram [ECG] [EKG]: Secondary | ICD-10-CM

## 2019-01-17 DIAGNOSIS — R0602 Shortness of breath: Secondary | ICD-10-CM | POA: Diagnosis not present

## 2019-01-17 DIAGNOSIS — R000 Tachycardia, unspecified: Secondary | ICD-10-CM

## 2019-01-17 DIAGNOSIS — R002 Palpitations: Secondary | ICD-10-CM | POA: Insufficient documentation

## 2019-01-17 LAB — BASIC METABOLIC PANEL
Anion gap: 9 (ref 5–15)
BUN: 7 mg/dL (ref 6–20)
CALCIUM: 8.6 mg/dL — AB (ref 8.9–10.3)
CO2: 24 mmol/L (ref 22–32)
CREATININE: 0.45 mg/dL (ref 0.44–1.00)
Chloride: 105 mmol/L (ref 98–111)
Glucose, Bld: 100 mg/dL — ABNORMAL HIGH (ref 70–99)
Potassium: 3.3 mmol/L — ABNORMAL LOW (ref 3.5–5.1)
SODIUM: 138 mmol/L (ref 135–145)

## 2019-01-17 LAB — CBC WITH DIFFERENTIAL/PLATELET
ABS IMMATURE GRANULOCYTES: 0.04 10*3/uL (ref 0.00–0.07)
BASOS PCT: 0 %
Basophils Absolute: 0 10*3/uL (ref 0.0–0.1)
EOS ABS: 0.1 10*3/uL (ref 0.0–0.5)
Eosinophils Relative: 1 %
HCT: 41.4 % (ref 36.0–46.0)
Hemoglobin: 14 g/dL (ref 12.0–15.0)
Immature Granulocytes: 0 %
Lymphocytes Relative: 15 %
Lymphs Abs: 1.4 10*3/uL (ref 0.7–4.0)
MCH: 29.2 pg (ref 26.0–34.0)
MCHC: 33.8 g/dL (ref 30.0–36.0)
MCV: 86.4 fL (ref 80.0–100.0)
MONO ABS: 0.5 10*3/uL (ref 0.1–1.0)
Monocytes Relative: 5 %
Neutro Abs: 7.4 10*3/uL (ref 1.7–7.7)
Neutrophils Relative %: 79 %
PLATELETS: 335 10*3/uL (ref 150–400)
RBC: 4.79 MIL/uL (ref 3.87–5.11)
RDW: 12 % (ref 11.5–15.5)
WBC: 9.3 10*3/uL (ref 4.0–10.5)
nRBC: 0 % (ref 0.0–0.2)

## 2019-01-17 LAB — TROPONIN I: Troponin I: <0.03 ng/mL (ref <0.03)

## 2019-01-17 LAB — BRAIN NATRIURETIC PEPTIDE: B Natriuretic Peptide: 24 pg/mL (ref 0.0–100.0)

## 2019-01-17 LAB — MAGNESIUM: Magnesium: 2 mg/dL (ref 1.7–2.4)

## 2019-01-17 LAB — HCG, QUANTITATIVE, PREGNANCY

## 2019-01-17 LAB — T4, FREE: FREE T4: 0.99 ng/dL (ref 0.82–1.77)

## 2019-01-17 LAB — TSH: TSH: 2.721 u[IU]/mL (ref 0.350–4.500)

## 2019-01-17 MED ORDER — SODIUM CHLORIDE 0.9 % IV BOLUS
1000.0000 mL | Freq: Once | INTRAVENOUS | Status: AC
  Administered 2019-01-17: 1000 mL via INTRAVENOUS

## 2019-01-17 MED ORDER — POTASSIUM CHLORIDE CRYS ER 20 MEQ PO TBCR
40.0000 meq | EXTENDED_RELEASE_TABLET | Freq: Once | ORAL | Status: AC
  Administered 2019-01-17: 40 meq via ORAL
  Filled 2019-01-17: qty 2

## 2019-01-17 MED ORDER — IOHEXOL 350 MG/ML SOLN
75.0000 mL | Freq: Once | INTRAVENOUS | Status: AC | PRN
  Administered 2019-01-17: 75 mL via INTRAVENOUS

## 2019-01-17 NOTE — Telephone Encounter (Signed)
Pt has had heart burn for one wk. Over the weekend pt started with nagging pain under lt breast that now go to the base of the neck on lt side.  pts right arm also feels weak  Like she has been lifting weights but she has not lifted wts. No problem with grip. Pt is having a lot of gas. No fever, sweats and no irreg heart beat. 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 Pt scheduled appt with Dr Einar Pheasant today at 10:20. If condition changes or worsens prior to appt pt will go to ED. FYI to Dr Einar Pheasant.

## 2019-01-17 NOTE — ED Provider Notes (Signed)
[facility] Emergency Department Provider Note  ____________________________________________  Time seen: Approximately 1:15 PM  I have reviewed the triage vital signs and the nursing notes.   HISTORY  Chief Complaint Chest Pain; Irregular Heart Beat; and Shortness of Breath   HPI Margaret CRANDELL is a 33 y.o. female with history of palpitations who presents from her primary care doctor's office for evaluation of chest pain.  Patient reports that she has had intermittent palpitations for over 10 years.  She is under a lot of stress with 2 small children at home who is still do not sleep through the night and also putting herself through a masters degree.  She drinks 2 to 3 cups of caffeine a day.  She reports over the last few days the palpitations have become progressively worse and now she is starting to have chest pain.  She reports a pulling pain that is located under her left breast radiating to her neck.  She denies constant shortness of breath however reports that when she feels the palpitations it is harder for her to take a breath in.  The pain in her chest is mild.  She denies any personal family history of blood clots, recent travel immobilization, leg pain or swelling, hemoptysis, or exogenous hormones.  Patient reports that she went to see her primary care doctor today because she was also having some discomfort in her right arm.  She reports that she was playing with her kids and throwing balls over the weekend so she was not sure if the pain in her arm was consistent with the activity she had done or due to chest pain.  She has no family history of malignant arrhythmias or heart disease.  She is also complaining of intermittent purplish discoloration of the toes in the right foot which has been ongoing for several months.  No known personal or family history of autoimmune disease.   Past Medical History:  Diagnosis Date  . Allergy   . Frequent headaches       Patient Active Problem List   Diagnosis Date Noted  . Raynaud's phenomenon without gangrene 01/17/2019  . Vitamin D deficiency 04/02/2018  . Routine general medical examination at a health care facility 07/17/2015  . 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 NASAL SINUS SURGERY  2007   turbinate reduction  . TONSILLECTOMY AND ADENOIDECTOMY  2007  . VAGINAL DELIVERY  2013   friable cervix with bleeding during pregnancy, anemia after pregnancy  . WISDOM TOOTH EXTRACTION      Prior to Admission medications   Medication Sig Start Date End Date Taking? Authorizing Provider  coconut oil OIL Apply 1 application topically as needed. 02/21/17   Juliene Pina, CNM    Allergies Vicodin [hydrocodone-acetaminophen]  Family History  Problem Relation Age of Onset  . Heart disease Mother   . Hyperlipidemia Mother   . Hypertension Mother   . Alcohol abuse Father   . Drug abuse Father   . Cancer Maternal Uncle 48       NHL  . Arthritis Maternal Grandmother   . Diabetes Maternal Grandmother   . Arthritis Maternal Grandfather   . Cancer Maternal Grandfather        Lung cancer  . Heart disease Maternal Grandfather   . Stroke Maternal Grandfather   . Hypertension Maternal Grandfather     Social History Social History   Tobacco  Use  . Smoking status: Never Smoker  . Smokeless tobacco: Never Used  Substance Use Topics  . Alcohol use: No  . Drug use: No    Review of Systems  Constitutional: Negative for fever. Eyes: Negative for visual changes. ENT: Negative for sore throat. Neck: No neck pain  Cardiovascular: + chest pain, palpitations Respiratory: + shortness of breath. Gastrointestinal: Negative for abdominal pain, vomiting or diarrhea. Genitourinary: Negative for dysuria. Musculoskeletal: Negative for back pain. + purple toes Skin: Negative for rash. Neurological: Negative for  headaches, weakness or numbness. Psych: No SI or HI  ____________________________________________   PHYSICAL EXAM:  VITAL SIGNS: Vitals:   01/17/19 1400 01/17/19 1430  BP: 114/80 130/84  Pulse: 90 (!) 103  Resp: (!) 21 (!) 24  SpO2: 100% 100%   Constitutional: Alert and oriented. Well appearing and in no apparent distress. HEENT:      Head: Normocephalic and atraumatic.         Eyes: Conjunctivae are normal. Sclera is non-icteric.       Mouth/Throat: Mucous membranes are moist.       Neck: Supple with no signs of meningismus. Cardiovascular: Tachycardic with regular rhythm. No murmurs, gallops, or rubs. 2+ symmetrical distal pulses are present in all extremities. No JVD. Respiratory: Normal respiratory effort. Lungs are clear to auscultation bilaterally. No wheezes, crackles, or rhonchi.  Gastrointestinal: Soft, non tender, and non distended with positive bowel sounds. No rebound or guarding. Musculoskeletal: Cyanosis of toes 2-3 with normal brisk capillary refill, warm extremities and normal DP and PT pulses. Nontender with normal range of motion in all extremities. No edema or erythema of extremities Neurologic: Normal speech and language. Face is symmetric. Moving all extremities. No gross focal neurologic deficits are appreciated. Skin: Skin is warm, dry and intact. No rash noted. Psychiatric: Mood and affect are normal. Speech and behavior are normal.  ____________________________________________   LABS (all labs ordered are listed, but only abnormal results are displayed)  Labs Reviewed  BASIC METABOLIC PANEL - Abnormal; Notable for the following components:      Result Value   Potassium 3.3 (*)    Glucose, Bld 100 (*)    Calcium 8.6 (*)    All other components within normal limits  TROPONIN I  HCG, QUANTITATIVE, PREGNANCY  CBC WITH DIFFERENTIAL/PLATELET  BRAIN NATRIURETIC PEPTIDE  TSH  T4, FREE  MAGNESIUM  TROPONIN I    ____________________________________________  EKG  ED ECG REPORT I, Rudene Re, the attending physician, personally viewed and interpreted this ECG.  Sinus tachycardia, rate of 121, prolonged QTC, normal axis, ST depressions on inferior and lateral leads with no ST elevation.  These depressions are new when compared to prior done earlier today.  14:22 - Sinus tachycardia, rate 100, normal intervals, normal axis, no STE or depressions. Improved when compared to prior ____________________________________________  RADIOLOGY  I have personally reviewed the images performed during this visit and I agree with the Radiologist's read.   Interpretation by Radiologist:  Dg Chest 2 View  Result Date: 01/17/2019 CLINICAL DATA:  Chest pain, shortness of breath, irregular heartbeat, atrial flutter EXAM: CHEST - 2 VIEW COMPARISON:  07/17/2015 FINDINGS: Normal heart size, mediastinal contours, and pulmonary vascularity. Lungs clear. No acute infiltrate, pleural effusion or pneumothorax. Bones unremarkable. IMPRESSION: No acute abnormalities. Electronically Signed   By: Lavonia Dana M.D.   On: 01/17/2019 12:40   Ct Angio Chest Pe W And/or Wo Contrast  Result Date: 01/17/2019 CLINICAL DATA:  Chest pain which shortness-of-breath and  irregular heartbeat. EXAM: CT ANGIOGRAPHY CHEST WITH CONTRAST TECHNIQUE: Multidetector CT imaging of the chest was performed using the standard protocol during bolus administration of intravenous contrast. Multiplanar CT image reconstructions and MIPs were obtained to evaluate the vascular anatomy. CONTRAST:  35mL OMNIPAQUE IOHEXOL 350 MG/ML SOLN COMPARISON:  Chest x-ray today. FINDINGS: Cardiovascular: Heart is normal size. Thoracic aorta is normal. Pulmonary arterial system is well opacified without evidence of emboli. Mediastinum/Nodes: No mediastinal or hilar adenopathy. Remaining mediastinal structures are normal. Lungs/Pleura: Lungs are adequately inflated without  focal airspace consolidation or effusion. 4 mm nodular density along the left major fissure likely fissural lymph node. Airways are normal. Upper Abdomen: No acute findings. Musculoskeletal: Normal. Review of the MIP images confirms the above findings. IMPRESSION: No acute cardiopulmonary disease and no evidence of pulmonary embolism. Electronically Signed   By: Marin Olp M.D.   On: 01/17/2019 13:47     ____________________________________________   PROCEDURES  Procedure(s) performed: None Procedures Critical Care performed:  None ____________________________________________   INITIAL IMPRESSION / ASSESSMENT AND PLAN / ED COURSE  33 y.o. female with history of palpitations who presents from her primary care doctor's office for evaluation of chest pain, SOB, palpitations.  Ddx dysrhythmia versus pulmonary embolism versus pericardial effusion versus heart failure versus myocarditis/ pericarditis versus ACS versus thyroid disease versus autoimmune disorder especially with Raynaud's seen on exam.   Patient does have an abnormal EKG with ST depressions in inferior and lateral leads.  There is no prior for comparison other than the one done at the PCPs office today.  On that EKG patient's rate was not as high and the ST depressions are not seen.  Plan for labs, CTA, monitoring, troponin x 2. Will give IVF  Clinical Course as of Jan 16 1510  Mon Jan 17, 2019  1509 Patient has been evaluated by Dr. Chancy Milroy in the emergency room who recommended outpatient follow-up.  He will see patient tomorrow at 10 AM.  Second troponin is pending and as long as that is negative patient will be discharged home.  Discussed in a return precautions with her.   [CV]    Clinical Course User Index [CV] Alfred Levins [place], MD     As part of my medical decision making, I reviewed the following data within the [place] notes reviewed and incorporated, Labs reviewed , EKG interpreted ,  Old EKG reviewed, Old chart reviewed, Radiograph reviewed , A consult was requested and obtained from this/these consultant(s) Cardiology, Notes from prior ED visits and [place] Controlled Substance Database    Pertinent labs & imaging results that were available during my care of the patient were reviewed by me and considered in my medical decision making (see chart for details).    ____________________________________________   FINAL CLINICAL IMPRESSION(S) / ED DIAGNOSES  Final diagnoses:  Chest pain, unspecified type  Palpitations  Abnormal EKG      NEW MEDICATIONS STARTED DURING THIS VISIT:  ED Discharge Orders    None       Note:  This document was prepared using Dragon voice recognition software and may include unintentional dictation errors.    Alfred Levins, [place], MD 01/17/19 [phone]

## 2019-01-17 NOTE — ED Notes (Signed)
Patient transported to X-ray 

## 2019-01-17 NOTE — ED Notes (Signed)
EKG showed ST in the 120's. Pt in NAD at this time.

## 2019-01-17 NOTE — Discharge Instructions (Signed)

## 2019-01-17 NOTE — ED Notes (Signed)
Resumed care from paige rn.  Pt alert  Sinus tach on monitor.  Family with pt. Pt waiting for er dispoistion.

## 2019-01-17 NOTE — ED Notes (Signed)
Pt ambulatory to restroom

## 2019-01-17 NOTE — Patient Instructions (Signed)
Your EKG showed tachycardia and with the chest pain I think that it is important that you get a quick work-up in the ER where they can do treatment and labs to get an answer today.   You are planning to go to [facility]. I will give them a call to let them know you are heading there.

## 2019-01-17 NOTE — ED Triage Notes (Signed)
Pt reports went to MD this am due to CP, SOB and irregular heart beat. Pt reports MD told her she was in a-flutter and her EKG was abnormal so to come to the ED. Pt reports the discomfort goes into her neck and right arm.

## 2019-01-17 NOTE — Consult Note (Signed)
Margaret Schwartz is a 33 y.o. female  [id number]  Primary Cardiologist: Neoma Laming Reason for Consultation: chest pain  HPI: This is a 32 year old white female with a past medical history of no significant medical problem presented to the hospital with chest pain palpitation and was found to have sinus tachycardia 120 bpm.  I was asked to evaluate the patient because patient had concomitant ST depressions with sinus tachycardia and was told that the patient had supraventricular arrhythmias noted by primary care physician and was sent here for evaluation.  Patient currently is in sinus rhythm about 80 bpm on monitor and denies any chest pain or shortness of breath or palpitation.   Review of Systems: Had chest pain and palpitation but no dizziness or syncope   Past Medical History:  Diagnosis Date  . Allergy   . Frequent headaches     (Not in a hospital admission)      Infusions:   Allergies  Allergen Reactions  . Vicodin [Hydrocodone-Acetaminophen] Nausea And Vomiting    Social History   Socioeconomic History  . Marital status: Married    Spouse name: Not on file  . Number of children: Not on file  . Years of education: Not on file  . Highest education level: Not on file  Occupational History  . Not on file  Social Needs  . Financial resource strain: Not on file  . Food insecurity:    Worry: Not on file    Inability: Not on file  . Transportation needs:    Medical: Not on file    Non-medical: Not on file  Tobacco Use  . Smoking status: Never Smoker  . Smokeless tobacco: Never Used  Substance and Sexual Activity  . Alcohol use: No  . Drug use: No  . Sexual activity: Yes    Birth control/protection: Condom  Lifestyle  . Physical activity:    Days per week: Not on file    Minutes per session: Not on file  . Stress: Not on file  Relationships  . Social connections:    Talks on phone: Not on file    Gets together: Not on file    Attends religious  service: Not on file    Active member of club or organization: Not on file    Attends meetings of clubs or organizations: Not on file    Relationship status: Not on file  . Intimate partner violence:    Fear of current or ex partner: Not on file    Emotionally abused: Not on file    Physically abused: Not on file    Forced sexual activity: Not on file  Other Topics Concern  . Not on file  Social History Narrative   Lives in [place] with husband and daughter.      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 Heart disease Mother   . Hyperlipidemia Mother   . Hypertension Mother   . Alcohol abuse Father   . Drug abuse Father   . Cancer Maternal Uncle 48       NHL  . Arthritis Maternal Grandmother   . Diabetes Maternal Grandmother   . Arthritis Maternal Grandfather   . Cancer Maternal Grandfather        Lung cancer  . Heart disease Maternal Grandfather   . Stroke Maternal  Grandfather   . Hypertension Maternal Grandfather     PHYSICAL EXAM: Vitals:   01/17/19 1400 01/17/19 1430  BP: 114/80 130/84  Pulse: 90 (!) 103  Resp: (!) 21 (!) 24  SpO2: 100% 100%    No intake or output data in the 24 hours ending 01/17/19 1501  General:  Well appearing. No respiratory difficulty HEENT: normal Neck: supple. no JVD. Carotids 2+ bilat; no bruits. No lymphadenopathy or thryomegaly appreciated. Cor: PMI nondisplaced. Regular rate & rhythm. No rubs, gallops or murmurs. Lungs: clear Abdomen: soft, nontender, nondistended. No hepatosplenomegaly. No bruits or masses. Good bowel sounds. Extremities: no cyanosis, clubbing, rash, edema Neuro: alert & oriented x 3, cranial nerves grossly intact. moves all 4 extremities w/o difficulty. Affect pleasant.  ECG: Sinus tachycardia with upsloping half millimeter ST depression inferolaterally on admission and follow-up EKG showing sinus rhythm without any  significant ST depression.  Results for orders placed or performed during the hospital encounter of 01/17/19 (from the past 24 hour(s))  Basic metabolic panel     Status: Abnormal   Collection Time: 01/17/19 11:53 AM  Result Value Ref Range   Sodium 138 135 - 145 mmol/L   Potassium 3.3 (L) 3.5 - 5.1 mmol/L   Chloride 105 98 - 111 mmol/L   CO2 24 22 - 32 mmol/L   Glucose, Bld 100 (H) 70 - 99 mg/dL   BUN 7 6 - 20 mg/dL   Creatinine, Ser 0.45 0.44 - 1.00 mg/dL   Calcium 8.6 (L) 8.9 - 10.3 mg/dL   GFR calc non Af Amer >60 >60 mL/min   GFR calc Af Amer >60 >60 mL/min   Anion gap 9 5 - 15  Troponin I - ONCE - STAT     Status: None   Collection Time: 01/17/19 11:53 AM  Result Value Ref Range   Troponin I <0.03 <0.03 ng/mL  hCG, quantitative, pregnancy     Status: None   Collection Time: 01/17/19 12:19 PM  Result Value Ref Range   hCG, Beta Chain, Quant, S <1 <5 mIU/mL  CBC with Differential/Platelet     Status: None   Collection Time: 01/17/19 12:19 PM  Result Value Ref Range   WBC 9.3 4.0 - 10.5 K/uL   RBC 4.79 3.87 - 5.11 MIL/uL   Hemoglobin 14.0 12.0 - 15.0 g/dL   HCT 41.4 36.0 - 46.0 %   MCV 86.4 80.0 - 100.0 fL   MCH 29.2 26.0 - 34.0 pg   MCHC 33.8 30.0 - 36.0 g/dL   RDW 12.0 11.5 - 15.5 %   Platelets 335 150 - 400 K/uL   nRBC 0.0 0.0 - 0.2 %   Neutrophils Relative % 79 %   Neutro Abs 7.4 1.7 - 7.7 K/uL   Lymphocytes Relative 15 %   Lymphs Abs 1.4 0.7 - 4.0 K/uL   Monocytes Relative 5 %   Monocytes Absolute 0.5 0.1 - 1.0 K/uL   Eosinophils Relative 1 %   Eosinophils Absolute 0.1 0.0 - 0.5 K/uL   Basophils Relative 0 %   Basophils Absolute 0.0 0.0 - 0.1 K/uL   Immature Granulocytes 0 %   Abs Immature Granulocytes 0.04 0.00 - 0.07 K/uL  Brain natriuretic peptide     Status: None   Collection Time: 01/17/19 12:19 PM  Result Value Ref Range   B Natriuretic Peptide 24.0 0.0 - 100.0 pg/mL  TSH     Status: None   Collection Time: 01/17/19 12:32 PM  Result Value Ref  Range   TSH 2.721 0.350 - 4.500 uIU/mL  T4, free     Status: None   Collection Time: 01/17/19 12:32 PM  Result Value Ref Range   Free T4 0.99 0.82 - 1.77 ng/dL  Magnesium     Status: None   Collection Time: 01/17/19  1:48 PM  Result Value Ref Range   Magnesium 2.0 1.7 - 2.4 mg/dL   Dg Chest 2 View  Result Date: 01/17/2019 CLINICAL DATA:  Chest pain, shortness of breath, irregular heartbeat, atrial flutter EXAM: CHEST - 2 VIEW COMPARISON:  07/17/2015 FINDINGS: Normal heart size, mediastinal contours, and pulmonary vascularity. Lungs clear. No acute infiltrate, pleural effusion or pneumothorax. Bones unremarkable. IMPRESSION: No acute abnormalities. Electronically Signed   By: Lavonia Dana M.D.   On: 01/17/2019 12:40   Ct Angio Chest Pe W And/or Wo Contrast  Result Date: 01/17/2019 CLINICAL DATA:  Chest pain which shortness-of-breath and irregular heartbeat. EXAM: CT ANGIOGRAPHY CHEST WITH CONTRAST TECHNIQUE: Multidetector CT imaging of the chest was performed using the standard protocol during bolus administration of intravenous contrast. Multiplanar CT image reconstructions and MIPs were obtained to evaluate the vascular anatomy. CONTRAST:  13mL OMNIPAQUE IOHEXOL 350 MG/ML SOLN COMPARISON:  Chest x-ray today. FINDINGS: Cardiovascular: Heart is normal size. Thoracic aorta is normal. Pulmonary arterial system is well opacified without evidence of emboli. Mediastinum/Nodes: No mediastinal or hilar adenopathy. Remaining mediastinal structures are normal. Lungs/Pleura: Lungs are adequately inflated without focal airspace consolidation or effusion. 4 mm nodular density along the left major fissure likely fissural lymph node. Airways are normal. Upper Abdomen: No acute findings. Musculoskeletal: Normal. Review of the MIP images confirms the above findings. IMPRESSION: No acute cardiopulmonary disease and no evidence of pulmonary embolism. Electronically Signed   By: Marin Olp M.D.   On: 01/17/2019 13:47      ASSESSMENT AND PLAN: Atypical chest pain with sinus tachycardia nonspecific ST-T changes not suggestive of ischemia.  Ruled out for myocardial infarction and all the rest of the labs are normal.  Patient can be discharged with follow-up in the office tomorrow at 10:00 and will do echocardiogram and Holter monitor and may consider doing stress test.  Devin Ganaway A

## 2019-01-17 NOTE — Progress Notes (Signed)
Subjective:     Margaret Schwartz is a 33 y.o. female presenting for Chest Pain (heartburn for 1 week. Pain in the left side of the neck and pain under left breast prsent. Stomach is upset. Has history of heartburn. ) and Toe issues (Raynauds syndrome? )     Chest Pain   This is a new problem. The current episode started in the past 7 days. The pain is present in the lateral region. The pain is at a severity of 1/10. The pain is mild. Quality: nagging pain. The pain does not radiate (but also endorses some nagging neck pain). Associated symptoms include abdominal pain, diaphoresis (nighttime) and irregular heartbeat (chronic). Pertinent negatives include no claudication, cough, exertional chest pressure, fever, headaches, leg pain, malaise/fatigue, nausea, numbness, shortness of breath, vomiting or weakness. Associated with: bending over. She has tried antacids for the symptoms. The treatment provided mild relief. There are no known risk factors.  Her family medical history is significant for hyperlipidemia and hypertension.  Pertinent negatives for family medical history include: no early MI and no sudden death.   Breast feeding and sleeping in chair occasionally   Also notices that her right arm feels sore  Pain is located on the left side   #Heartburn - worsening - more reflux feeling - at night will feel her stomach becoming firm and then things moving through her abdomen  #Toes  - occasionally purple - on the 3rd and 4th digit - was itching and thought maybe a fungal infection - then some pain and it locked in place - wears socks - has been using lotion - did have some sores that seem to flare up and go away over the course of a few weeks  Review of Systems  Constitutional: Positive for diaphoresis (nighttime). Negative for fever and malaise/fatigue.  Respiratory: Negative for cough and shortness of breath.   Cardiovascular: Positive for chest pain. Negative for  claudication.  Gastrointestinal: Positive for abdominal pain. Negative for nausea and vomiting.  Neurological: Negative for weakness, numbness and headaches.     Social History   Tobacco Use  Smoking Status Never Smoker  Smokeless Tobacco Never Used        Objective:    BP Readings from Last 3 Encounters:  01/17/19 128/90  04/02/18 106/62  03/09/18 112/66   Wt Readings from Last 3 Encounters:  01/17/19 115 lb 12 oz (52.5 kg)  04/02/18 112 lb 4 oz (50.9 kg)  03/09/18 115 lb 8 oz (52.4 kg)    BP 128/90   Pulse 89   Temp 98.5 F (36.9 C)   Resp 14   Ht 5\' 2"  (1.575 m)   Wt 115 lb 12 oz (52.5 kg)   LMP 01/06/2019   SpO2 100%   BMI 21.17 kg/m    Physical Exam Constitutional:      General: She is not in acute distress.    Appearance: She is well-developed. She is not diaphoretic.  HENT:     Right Ear: External ear normal.     Left Ear: External ear normal.     Nose: Nose normal.  Eyes:     Conjunctiva/sclera: Conjunctivae normal.  Neck:     Musculoskeletal: Neck supple.  Cardiovascular:     Rate and Rhythm: Regular rhythm. Tachycardia present.     Heart sounds: No murmur.  Pulmonary:     Effort: Pulmonary effort is normal. No tachypnea or respiratory distress.     Breath sounds: Normal breath sounds.  Chest:     Chest wall: No mass, deformity, tenderness or crepitus.  Abdominal:     General: Bowel sounds are normal.     Palpations: Abdomen is soft. There is no hepatomegaly or mass.     Tenderness: There is abdominal tenderness in the epigastric area, periumbilical area and suprapubic area. There is no guarding.  Musculoskeletal:     Right lower leg: She exhibits no tenderness. No edema.     Left lower leg: She exhibits no tenderness. No edema.  Skin:    General: Skin is warm and dry.     Capillary Refill: Capillary refill takes less than 2 seconds.     Comments: Right 3rd toe with some raised skin which appears slightly more red compared to surrounding  skin.   Neurological:     General: No focal deficit present.     Mental Status: She is alert. Mental status is at baseline.  Psychiatric:        Mood and Affect: Mood normal.        Behavior: Behavior normal.      EKG: tachycardic rate 111 with some flutter characteristics     Assessment & Plan:   Problem List Items Addressed This Visit      Other   Raynaud's phenomenon without gangrene    Likely what is going on. Recommended continuing home management, if worsening return to see PCP       Other Visit Diagnoses    Chest pain, unspecified type    -  Primary   Relevant Orders   EKG 12-Lead (Completed)   Tachycardia       Abnormal EKG         Sign out to [facility], pt going by private vehicle.  EKG is abnormal and with chest pain which is not reproducible on exam and tachycardia concerning for possible cardiac source and feel expedited work-up is necessary.   Discussed that persistent tachycardia (>10 years per patient) could cause strain on heart and as result may make her higher risk.   Advised pcp follow-up after ER pending work-up as may want to consider starting a medication to lower HR which may help with raynaud's like symptoms as well   Return for after the ER with your PCP.  Lesleigh Noe, MD

## 2019-01-17 NOTE — Assessment & Plan Note (Signed)
Likely what is going on. Recommended continuing home management, if worsening return to see PCP

## 2019-01-18 ENCOUNTER — Ambulatory Visit: Payer: No Typology Code available for payment source | Admitting: Cardiovascular Disease

## 2019-01-18 ENCOUNTER — Encounter: Payer: Self-pay | Admitting: Cardiovascular Disease

## 2019-01-18 ENCOUNTER — Ambulatory Visit (INDEPENDENT_AMBULATORY_CARE_PROVIDER_SITE_OTHER): Payer: No Typology Code available for payment source

## 2019-01-18 VITALS — BP 108/82 | Ht 62.0 in | Wt 115.5 lb

## 2019-01-18 DIAGNOSIS — R079 Chest pain, unspecified: Secondary | ICD-10-CM | POA: Insufficient documentation

## 2019-01-18 DIAGNOSIS — R000 Tachycardia, unspecified: Secondary | ICD-10-CM | POA: Diagnosis not present

## 2019-01-18 DIAGNOSIS — R002 Palpitations: Secondary | ICD-10-CM | POA: Diagnosis not present

## 2019-01-18 NOTE — Telephone Encounter (Signed)
Called pt and advised her we are aware of the referral and that she should make sure that's it's also approved by insurance ([employer]), spouse said that they have the cardiology appt approved

## 2019-01-18 NOTE — Progress Notes (Signed)
Cardiology Office Note  Date:  01/18/2019   ID:  Margaret Schwartz, DOB 16-Sep-1986, MRN [id number]  PCP:  Abner Greenspan, MD   Chief Complaint  Patient presents with  . other    Follow up from [facility]; chest pain and abnormal EKG. Meds reviewed by the pt. verbally. Pt. c/o dizziness, lightheaded when standing, palpitations, difficulty getting breath, rapid heart beats, chest pain with pressure in left side neck.      HPI:  Margaret Schwartz is a 33 y.o. female with history of  palpitations /elevated heart rate at baseline Who presents by referral from Dr. Glori Bickers for consultation of her chest pain, tachycardia, palpitations  She presented to the emergency room yesterday from her primary care doctor's office for evaluation of chest pain.    Chest pain described as pain under the left breast radiating left flank sometimes up the left neck.  Seems worse when she bends forward looks upwards, strains her neck Carries her 61-year-old child typically left arm, sometimes up flights of stairs   intermittent palpitations for over 10 years.    lot of stress with 2 small children at home who is still do not sleep through the night and also putting herself through a masters degree. Former Marine scientist    She drinks 2 to 3 cups of caffeine a day.  Notices more tachycardia and palpitations after drinking caffeine  Was playing ball with her kids over the weekend, may explain some of the right arm discomfort  EKG personally reviewed by myself on todays visit Shows normal sinus rhythm with rate 103 bpm no significant ST or T wave changes  Orthostatics performed heart rate 93 supine up to 100 with standing No significant change with blood pressure 117/77 supine up to 125/75 standing    PMH:   has a past medical history of Allergy and Frequent headaches.  PSH:    Past Surgical History:  Procedure Laterality Date  . NASAL SINUS SURGERY  2007   turbinate reduction  . TONSILLECTOMY AND ADENOIDECTOMY  2007  .  VAGINAL DELIVERY  2013   friable cervix with bleeding during pregnancy, anemia after pregnancy  . WISDOM TOOTH EXTRACTION      Current Outpatient Medications  Medication Sig Dispense Refill  . coconut oil OIL Apply 1 application topically as needed.  0   No current facility-administered medications for this visit.      Allergies:   Vicodin [hydrocodone-acetaminophen]   Social History:  The patient  reports that she has never smoked. She has never used smokeless tobacco. She reports that she does not drink alcohol or use drugs.   Family History:   family history includes Alcohol abuse in her father; Arthritis in her maternal grandfather and maternal grandmother; Cancer in her maternal grandfather; Cancer (age of onset: 79) in her maternal uncle; Diabetes in her maternal grandmother; Drug abuse in her father; Heart disease in her father, maternal grandfather, and mother; Hyperlipidemia in her mother; Hypertension in her father, maternal grandfather, and mother; Stroke in her maternal grandfather.    Review of Systems: Review of Systems  Constitutional: Negative.   Respiratory: Negative.   Cardiovascular: Positive for chest pain and palpitations.       Right arm pain, left neck pain Tachycardia  Gastrointestinal: Negative.   Musculoskeletal: Negative.   Neurological: Negative.   Psychiatric/Behavioral: Negative.   All other systems reviewed and are negative.    PHYSICAL EXAM: VS:  BP 108/82 (BP Location: Right Arm, Patient Position: Sitting,  Cuff Size: Normal)   Ht 5\' 2"  (1.575 m)   Wt 115 lb 8 oz (52.4 kg)   LMP 01/06/2019 Comment: neg preg test  BMI 21.13 kg/m  , BMI Body mass index is 21.13 kg/m. GEN: Well nourished, well developed, in no acute distress  HEENT: normal  Neck: no JVD, carotid bruits, or masses Cardiac: RRR; labile heart rate , some respiratory variation , no murmurs, rubs, or gallops,no edema  Respiratory:  clear to auscultation bilaterally, normal work  of breathing GI: soft, nontender, nondistended, + BS MS: no deformity or atrophy  Skin: warm and dry, no rash Neuro:  Strength and sensation are intact Psych: euthymic mood, full affect   Recent Labs: 03/31/2018: ALT 10 01/17/2019: B Natriuretic Peptide 24.0; BUN 7; Creatinine, Ser 0.45; Hemoglobin 14.0; Magnesium 2.0; Platelets 335; Potassium 3.3; Sodium 138; TSH 2.721    Lipid Panel Lab Results  Component Value Date   CHOL 127 03/31/2018   HDL 57.40 03/31/2018   LDLCALC 63 03/31/2018   TRIG 36.0 03/31/2018      Wt Readings from Last 3 Encounters:  01/18/19 115 lb 8 oz (52.4 kg)  01/17/19 115 lb (52.2 kg)  01/17/19 115 lb 12 oz (52.5 kg)     ASSESSMENT AND PLAN:  Tachycardia - Baseline sinus tachycardia  Heart rate elevated in the emergency room up to 120 EKG initially in primary care mislabeling rhythm is atrial flutter, this is in fact sinus tachycardia -Unable to exclude other rhythms atrial tachycardia or short runs of SVT as she does appreciate short runs of fluttering that take her breath away -Suggested she wear a event monitor for further evaluation Zio monitor ordered and placed today We will call her with the results We did discuss possibly using beta-blockers either propranolol as needed or metoprolol on a regular basis if symptoms get worse  palpitations - Plan: EKG 12-Lead Unable to exclude arrhythmia such as short runs of atrial tachycardia or SVT Event monitor placed as above  Chest pain, unspecified type - Plan: EKG 12-Lead, LONG TERM MONITOR (3-14 DAYS) Atypical in nature, likely musculoskeletal Unable to exclude repetitive injury from carrying her child on the left or musculoskeletal injury from playing ball with her kids over the weekend Less likely ischemia We have not ordered any ischemic work-up at this time but we did discuss stress testing, CT coronary calcium scoring She has very low risk profile for coronary disease as she is non-smoker, no  diabetes no strong family history  Disposition:   We will call her with the results of the monitor  Patient was seen in consultation for Dr. Glori Bickers and will be referred back to her office for ongoing care of the issues detailed above   Total encounter time more than 60 minutes  Greater than 50% was spent in counseling and coordination of care with the patient    Orders Placed This Encounter  Procedures  . LONG TERM MONITOR (3-14 DAYS)  . EKG 12-Lead     Signed, Esmond Plants, M.D., Ph.D. 01/18/2019  [place], [place]

## 2019-01-18 NOTE — Patient Instructions (Signed)
Medication Instructions:  No changes  If you need a refill on your cardiac medications before your next appointment, please call your pharmacy.    Lab work: No new labs needed   If you have labs (blood work) drawn today and your tests are completely normal, you will receive your results only by: Marland Kitchen MyChart Message (if you have MyChart) OR . A paper copy in the mail If you have any lab test that is abnormal or we need to change your treatment, we will call you to review the results.   Testing/Procedures: We will order a Zio monitor for paroxysmal tachycardia, chest pain   Follow-Up: At [facility], you and your health needs are our priority.  As part of our continuing mission to provide you with exceptional heart care, we have created designated Provider Care Teams.  These Care Teams include your primary Cardiologist (physician) and Advanced Practice Providers (APPs -  Physician Assistants and Nurse Practitioners) who all work together to provide you with the care you need, when you need it.  . You will need a follow up appointment as needed  . Providers on your designated Care Team:   . Murray Hodgkins, NP . Christell Faith, PA-C . Marrianne Mood, PA-C  Any Other Special Instructions Will Be Listed Below (If Applicable).  For educational health videos Log in to : [email or web address] Or : [email or web address], password : [email or web address]

## 2019-01-20 ENCOUNTER — Ambulatory Visit: Payer: No Typology Code available for payment source | Admitting: Cardiovascular Disease

## 2019-01-29 ENCOUNTER — Telehealth: Payer: No Typology Code available for payment source | Admitting: Nurse Practitioner

## 2019-01-29 DIAGNOSIS — R6889 Other general symptoms and signs: Secondary | ICD-10-CM

## 2019-01-29 NOTE — Progress Notes (Signed)
E-Visit for Corona Virus Screening Based on your current symptoms, it seems unlikely that your symptoms are related to the [place] virus.   Coronavirus disease 2019 (COVID-19) is a respiratory illness that can spread from person to person. The virus that causes COVID-19 is a new virus that was first identified in the country of [place] but is now found in multiple other countries and has spread to the [place].  Symptoms associated with the virus are mild to severe fever, cough, and shortness of breath. There is currently no vaccine to protect against COVID-19, and there is no specific antiviral treatment for the virus.  It is vitally important that if you feel that you have an infection such as this virus or any other virus that you stay home and away from places where you may spread it to others.  Currently, not all patients are not being tested. If the symptoms are mild and there is not a known exposure, performing the test is not indicated.  You can use medication such as A prescription inhaler called Albuterol MDI 90 mcg /actuation 2 puffs every 4 hours as needed for shortness of breath, wheezing, cough  Reduce your risk of any infection by using the same precautions used for avoiding the common cold or flu:  Marland Kitchen Wash your hands often with soap and warm water for at least 20 seconds.  If soap and water are not readily available, use an alcohol-based hand sanitizer with at least 60% alcohol.  . If coughing or sneezing, cover your mouth and nose by coughing or sneezing into the elbow areas of your shirt or coat, into a tissue or into your sleeve (not your hands). . Avoid shaking hands with others and consider head nods or verbal greetings only. . Avoid touching your eyes, nose, or mouth with unwashed hands.  . Avoid close contact with people who are sick. . If you have some symptoms but not all symptoms, continue to monitor your condition at home . If your symptoms worsen over the next 48 hours  seek medical attention through the Emergency Department . If you are having a medical emergency, call 911.  HOME CARE . Only take medications as instructed by your medical team. . Drink plenty of fluids and get plenty of rest. . A steam or ultrasonic humidifier can help if you have congestion.   GET HELP RIGHT AWAY IF: . You develop worsening fever. . You become short of breath . You cough up blood. . Your symptoms become more severe MAKE SURE YOU   Understand these instructions.  Will watch your condition.  Will get help right away if you are not doing well or get worse.  Your e-visit answers were reviewed by a board certified advanced clinical practitioner to complete your personal care plan.  Depending on the condition, your plan could have included both over the counter or prescription medications.  If there is a problem please reply once you have received a response from your provider. Your safety is important to [place].  If you have drug allergies check your prescription carefully.    You can use MyChart to ask questions about today's visit, request a non-urgent call back, or ask for a work or school excuse for 24 hours related to this e-Visit. If it has been greater than 24 hours you will need to follow up with your provider, or enter a new e-Visit to address those concerns. You will get an e-mail in the next two days asking  about your experience.  I hope that your e-visit has been valuable and will speed your recovery. Thank you for using e-visits.   5 minutes spent reviewing and documenting in chart.

## 2019-01-31 ENCOUNTER — Telehealth: Payer: Self-pay

## 2019-01-31 MED ORDER — CEFDINIR 300 MG PO CAPS: 300.0000 mg | ORAL_CAPSULE | Freq: Two times a day (BID) | ORAL | 0 refills | Status: DC

## 2019-01-31 NOTE — Telephone Encounter (Signed)
Pt left v/m; pt had evisit on 01/29/19 with viral pharyngitis and bronchitis; pt was given prednisone. Pt has been sick since 01/27/19. Presently pt has fever 99.5, prod cough with green phlegm that is brown tinged.pt is breat feeding but wants to know if needs abx. Since pt is still sick. Pt request cb.

## 2019-01-31 NOTE — Telephone Encounter (Signed)
Pt notified of Dr. Marliss Coots comments and Rx sent to her preferred pharmacy

## 2019-01-31 NOTE — Telephone Encounter (Signed)
Given length of illness and productive cough I want to treat her with cefdinir I pended the px - ? What pharmacy to send  Please ask her and send it Margaret Schwartz  F/u if no improvement

## 2019-02-01 ENCOUNTER — Encounter: Payer: Self-pay | Admitting: Family Medicine

## 2019-02-03 ENCOUNTER — Ambulatory Visit: Payer: No Typology Code available for payment source | Admitting: Cardiovascular Disease

## 2019-02-03 ENCOUNTER — Encounter: Payer: Self-pay | Admitting: Internal Medicine

## 2019-02-03 ENCOUNTER — Other Ambulatory Visit: Payer: Self-pay

## 2019-02-03 ENCOUNTER — Telehealth: Payer: Self-pay

## 2019-02-03 ENCOUNTER — Ambulatory Visit (INDEPENDENT_AMBULATORY_CARE_PROVIDER_SITE_OTHER): Payer: No Typology Code available for payment source | Admitting: Internal Medicine

## 2019-02-03 VITALS — BP 102/70 | HR 140 | Temp 98.4°F | Ht 62.0 in | Wt 113.0 lb

## 2019-02-03 DIAGNOSIS — J069 Acute upper respiratory infection, unspecified: Secondary | ICD-10-CM | POA: Diagnosis not present

## 2019-02-03 MED ORDER — ALBUTEROL SULFATE 108 (90 BASE) MCG/ACT IN AEPB: 1.0000 | INHALATION_SPRAY | Freq: Three times a day (TID) | RESPIRATORY_TRACT | 0 refills | Status: AC | PRN

## 2019-02-03 NOTE — Progress Notes (Signed)
   Subjective:    Patient ID: Margaret Schwartz, female    DOB: [birth year], 33 y.o.   MRN: [id number]  HPI Here due to ongoing respiratory infection  Has been sick for a week Exposed to very sick daughter right before that She was seen at [facility] and flu/strep both negative  No fever---but up to 99.9 Heart is beating fast Especially bad cough when lying down Getting recurrence of leg aches General malaise--fatigued (very unusual for her) Started with productive cough--green with some blood tinges. Mostly at night Some better since starting the antibiotic Thought she was better yesterday--did some vacuuming and house work Then worse today Having chills and sweats Not really SOB---but doesn't feel right with heart going fast  Review of Systems No vomiting or diarrhea No appetite Not drinking too much either No rash    Objective:   Physical Exam  Constitutional: She appears well-developed. No distress.  HENT:  No sinus tenderness TMs normal Mild nasal congestion Pharynx without injection or tonsillar exudates  Neck: No thyromegaly present.  Slightly enlarged/tender anterior cervical nodes  Respiratory: Effort normal and breath sounds normal. No respiratory distress. She has no wheezes. She has no rales.           Assessment & Plan:

## 2019-02-03 NOTE — Telephone Encounter (Signed)
Will evaluate at OV and proceed with CXR if indicated

## 2019-02-03 NOTE — Assessment & Plan Note (Signed)
Not in chest but she feels like she may have reactive airways at night (used albuterol in past) No wheezing now ??sinus infection ---but is on cefdinir analgesics

## 2019-02-03 NOTE — Telephone Encounter (Signed)
Pt called still having fever since 01/27/19; last night fever was 100 and now Temp is 99.6. pt still has prod cough with green phlegm,chest has burning pain, sometimes sharp pain on and off. and pt is concerned could have pneumonia. Both legs hurt from knees down.cough is worse at night. Pt scheduled appt with Dr Silvio Pate 02/03/19 at 3 PM. ED precautions given.

## 2019-02-04 ENCOUNTER — Encounter: Payer: Self-pay | Admitting: Family Medicine

## 2019-02-15 ENCOUNTER — Encounter: Payer: Self-pay | Admitting: Family Medicine

## 2019-02-15 MED ORDER — AZELASTINE-FLUTICASONE 137-50 MCG/ACT NA SUSP: NASAL | 3 refills | Status: DC

## 2019-02-15 MED ORDER — FLUCONAZOLE 150 MG PO TABS: 150.0000 mg | ORAL_TABLET | Freq: Once | ORAL | 0 refills | Status: AC

## 2019-03-02 NOTE — Telephone Encounter (Signed)
Pt had appt 01/17/19.

## 2019-03-04 ENCOUNTER — Telehealth: Payer: Self-pay

## 2019-03-04 MED ORDER — PROPRANOLOL HCL 10 MG PO TABS: 10.0000 mg | ORAL_TABLET | Freq: Three times a day (TID) | ORAL | 1 refills | Status: DC | PRN

## 2019-03-04 NOTE — Telephone Encounter (Signed)
Call to patient to review results from long term monitor. Reviewed suggestions from provider.  She verbalized understanding and agreed that it wouldn't be a bad idea to get Rx for propranolol to have on hand just in case she has sustained tachycardia or palpitations.   She reports that she is breast feeding in the night so sleep has been altered. She is using coffee to compensate which may be leading to palpitations.   Advised pt to attempt decrease of coffee intake and stress.   Advised pt to call for any further questions or concerns.

## 2019-03-05 IMAGING — CT CT ANGIO CHEST
2 of 6 series · 19 of 46 positions shown · IV contrast (APPLIED)
Comparison: Chest x-ray today.

CLINICAL DATA: Chest pain which shortness-of-breath and irregular
heartbeat.

EXAM:
CT ANGIOGRAPHY CHEST WITH CONTRAST
TECHNIQUE: Multidetector CT imaging of the chest was performed using the
standard protocol during bolus administration of intravenous
contrast. Multiplanar CT image reconstructions and MIPs were
obtained to evaluate the vascular anatomy.
CONTRAST:  75mL OMNIPAQUE IOHEXOL 350 MG/ML SOLN

[Series 5: thins · axial · 0.63mm/px · z∈[-328,-84]mm · 16 of 268 slices shown]
[im 12/268  lung]
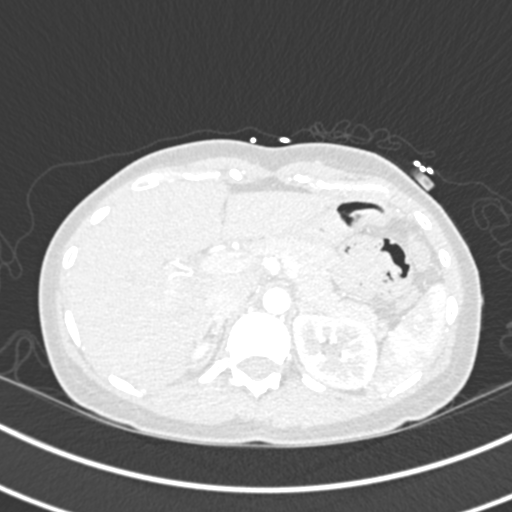
[im 35/268  soft-tissue]
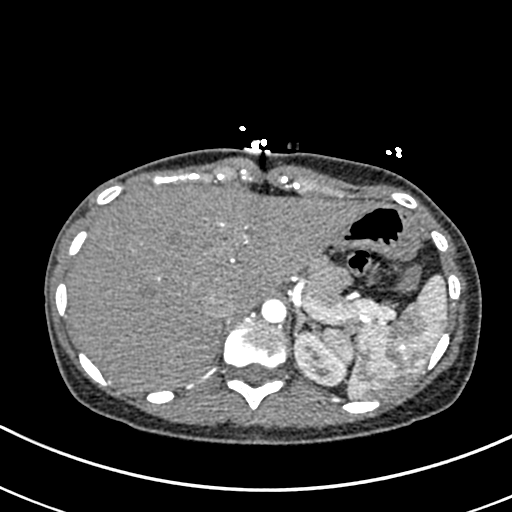
[im 47/268  lung]
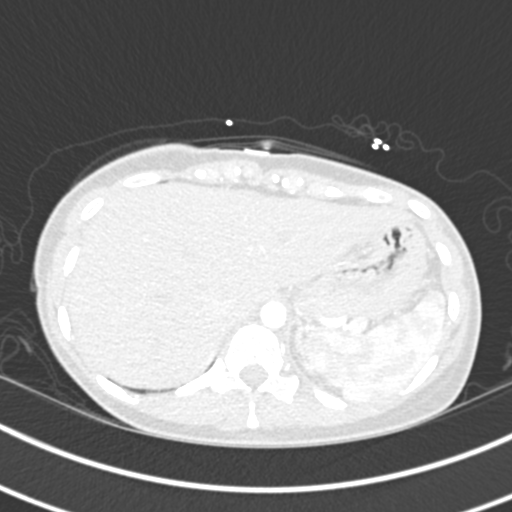
[im 59/268  soft-tissue]
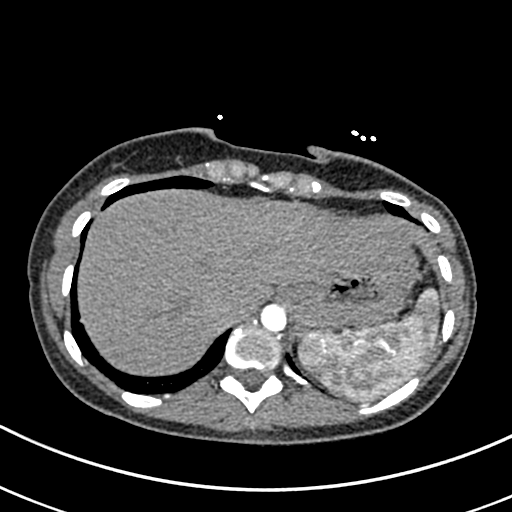
[im 82/268  lung]
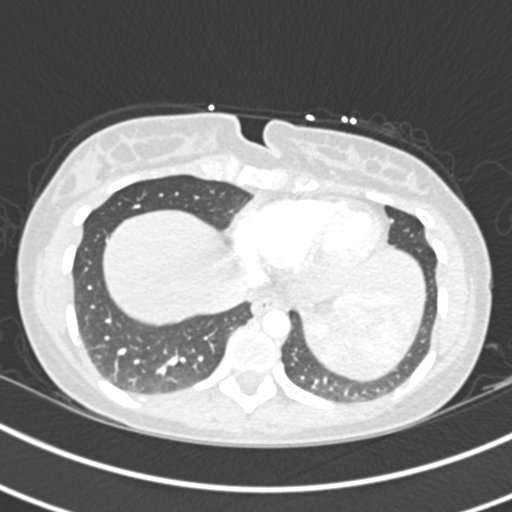
[im 93/268  soft-tissue]
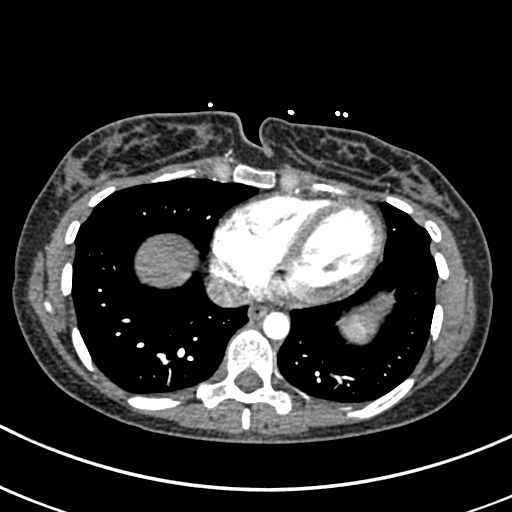
[im 105/268  lung]
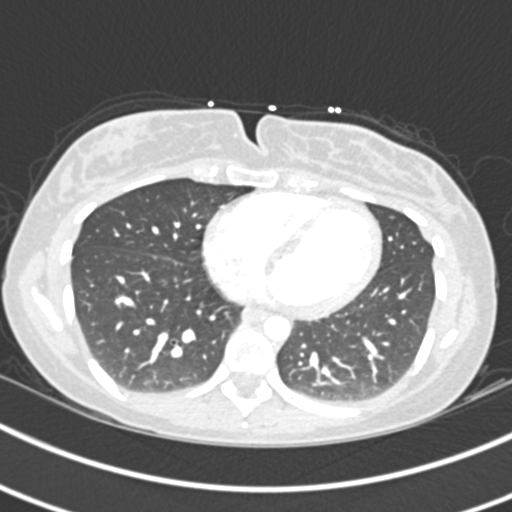
[im 128/268  soft-tissue]
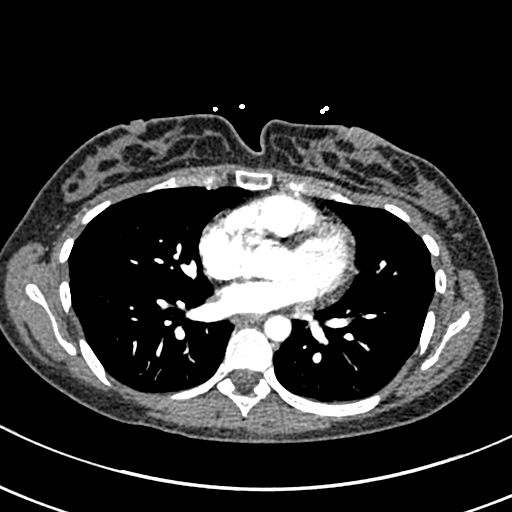
[im 140/268  lung]
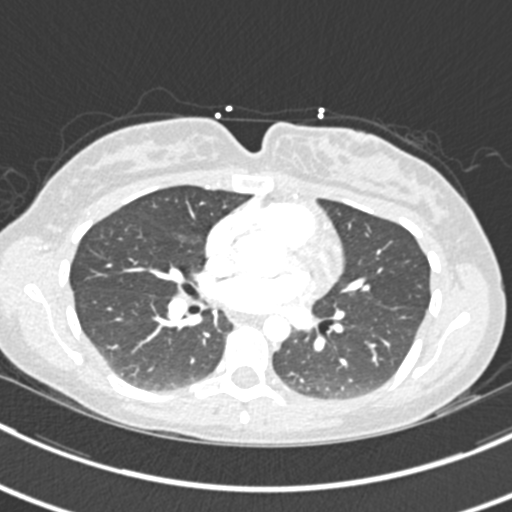
[im 163/268  soft-tissue]
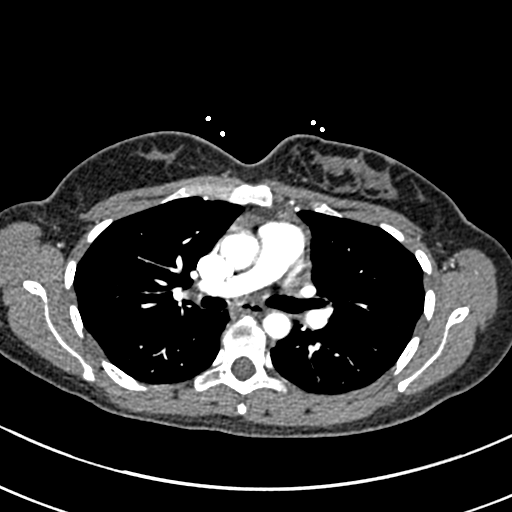
[im 175/268  lung]
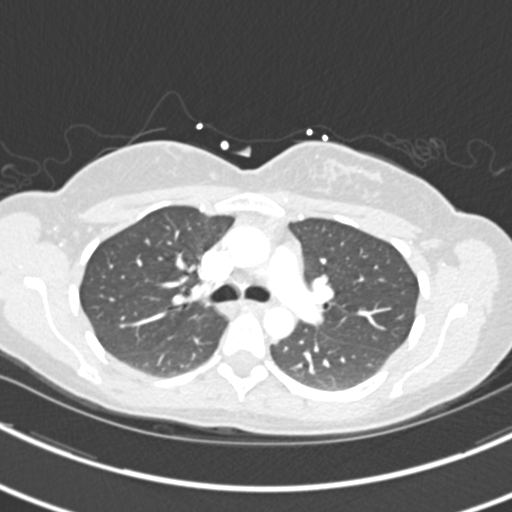
[im 186/268  soft-tissue]
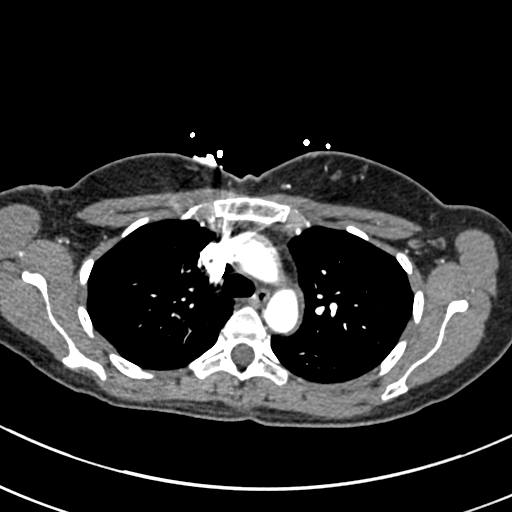
[im 209/268  lung]
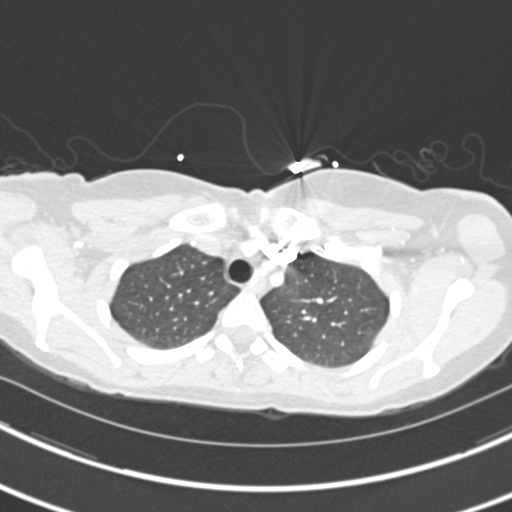
[im 221/268  soft-tissue]
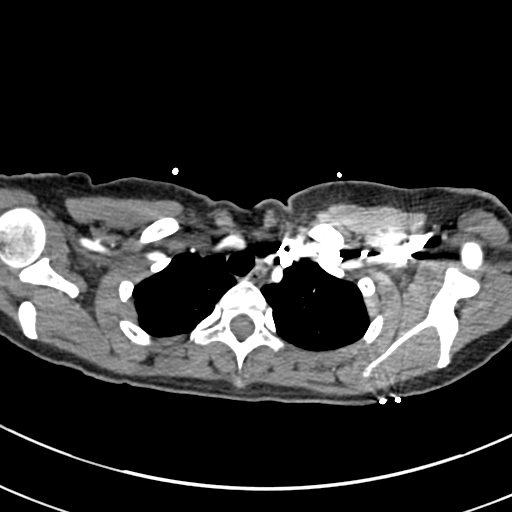
[im 233/268  lung]
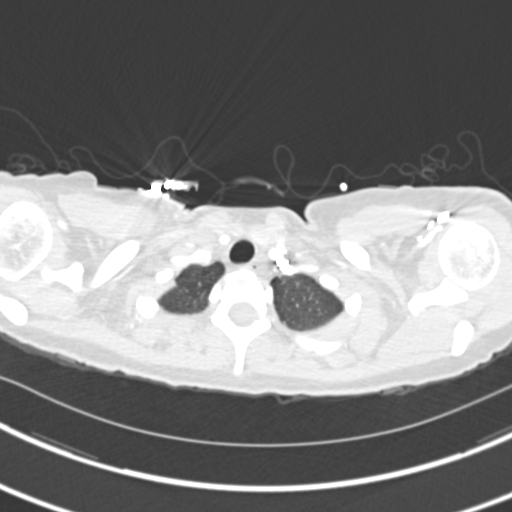
[im 256/268  soft-tissue]
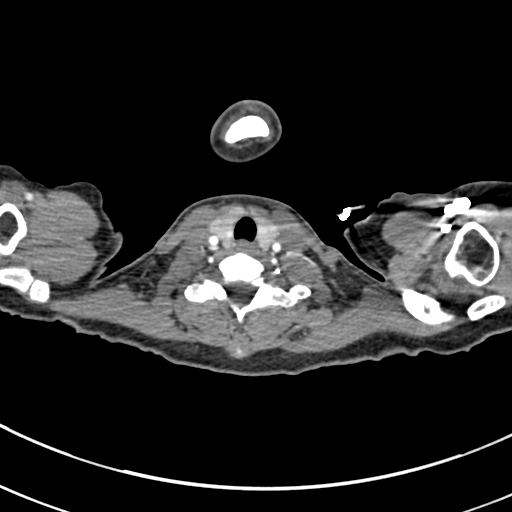

[Series 7: coronal mpr · coronal · 0.53mm/px · 3 of 68 slices shown]
[im 17/68  soft-tissue]
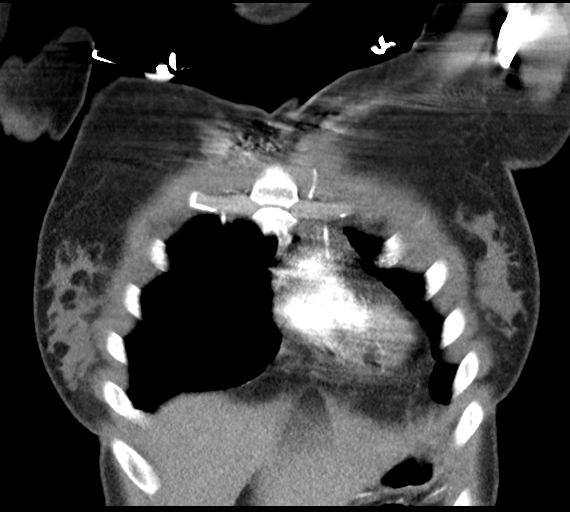
[im 34/68  soft-tissue]
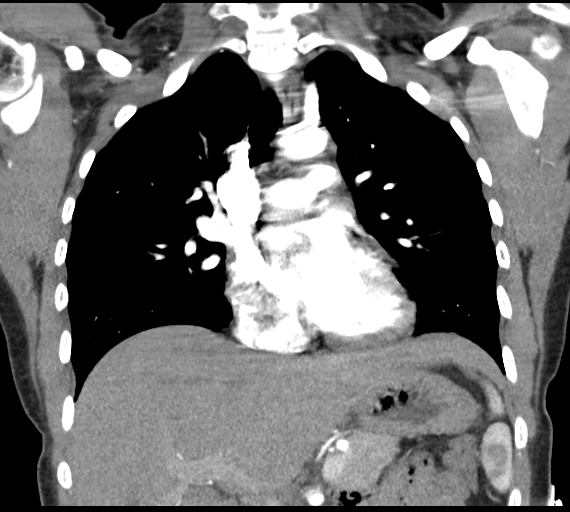
[im 51/68  soft-tissue]
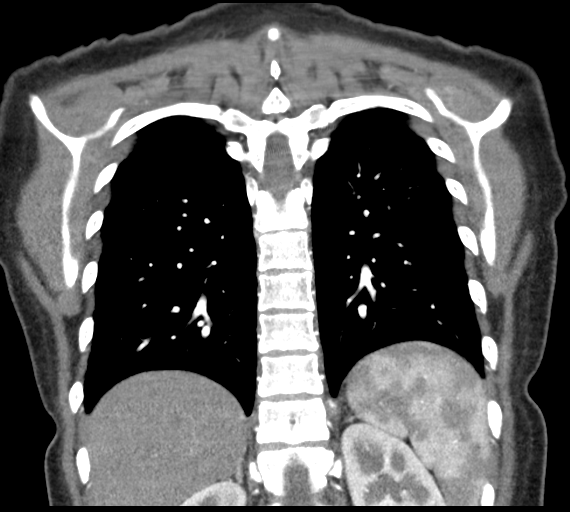

[19 of 46 positions shown; findings below may reference images not displayed]

FINDINGS: Cardiovascular: Heart is normal size. Thoracic aorta is normal.
Pulmonary arterial system is well opacified without evidence of
emboli.

Mediastinum/Nodes: No mediastinal or hilar adenopathy. Remaining
mediastinal structures are normal.

Lungs/Pleura: Lungs are adequately inflated without focal airspace
consolidation or effusion. 4 mm nodular density along the left major
fissure likely fissural lymph node. Airways are normal.

Upper Abdomen: No acute findings.

Musculoskeletal: Normal.

Review of the MIP images confirms the above findings.
IMPRESSION: No acute cardiopulmonary disease and no evidence of pulmonary
embolism.

## 2019-03-10 ENCOUNTER — Encounter: Payer: Self-pay | Admitting: Family Medicine

## 2019-04-07 ENCOUNTER — Encounter: Payer: Self-pay | Admitting: Family Medicine

## 2020-05-04 ENCOUNTER — Other Ambulatory Visit: Payer: Self-pay

## 2020-05-04 ENCOUNTER — Ambulatory Visit (INDEPENDENT_AMBULATORY_CARE_PROVIDER_SITE_OTHER): Payer: No Typology Code available for payment source | Admitting: Family Medicine

## 2020-05-04 ENCOUNTER — Encounter: Payer: Self-pay | Admitting: Family Medicine

## 2020-05-04 VITALS — BP 118/78 | HR 86 | Temp 97.0°F | Ht 62.5 in | Wt 117.1 lb

## 2020-05-04 DIAGNOSIS — I7300 Raynaud's syndrome without gangrene: Secondary | ICD-10-CM

## 2020-05-04 DIAGNOSIS — Z0000 Encounter for general adult medical examination without abnormal findings: Secondary | ICD-10-CM

## 2020-05-04 DIAGNOSIS — E559 Vitamin D deficiency, unspecified: Secondary | ICD-10-CM

## 2020-05-04 LAB — CBC WITH DIFFERENTIAL/PLATELET
Basophils Absolute: 0 10*3/uL (ref 0.0–0.1)
Basophils Relative: 0.8 % (ref 0.0–3.0)
Eosinophils Absolute: 0.1 10*3/uL (ref 0.0–0.7)
Eosinophils Relative: 1.8 % (ref 0.0–5.0)
HCT: 38.4 % (ref 36.0–46.0)
Hemoglobin: 13.1 g/dL (ref 12.0–15.0)
Lymphocytes Relative: 28.7 % (ref 12.0–46.0)
Lymphs Abs: 1.4 10*3/uL (ref 0.7–4.0)
MCHC: 34.1 g/dL (ref 30.0–36.0)
MCV: 87.4 fl (ref 78.0–100.0)
Monocytes Absolute: 0.4 10*3/uL (ref 0.1–1.0)
Monocytes Relative: 8 % (ref 3.0–12.0)
Neutro Abs: 3 10*3/uL (ref 1.4–7.7)
Neutrophils Relative %: 60.7 % (ref 43.0–77.0)
Platelets: 272 10*3/uL (ref 150.0–400.0)
RBC: 4.39 Mil/uL (ref 3.87–5.11)
RDW: 12.6 % (ref 11.5–15.5)
WBC: 4.9 10*3/uL (ref 4.0–10.5)

## 2020-05-04 LAB — LIPID PANEL
Cholesterol: 140 mg/dL (ref 0–200)
HDL: 56.2 mg/dL (ref >39.00)
LDL Cholesterol: 76 mg/dL (ref 0–99)
NonHDL: 83.59
Total CHOL/HDL Ratio: 2
Triglycerides: 38 mg/dL (ref 0.0–149.0)
VLDL: 7.6 mg/dL (ref 0.0–40.0)

## 2020-05-04 LAB — COMPREHENSIVE METABOLIC PANEL
ALT: 11 U/L (ref 0–35)
AST: 11 U/L (ref 0–37)
Albumin: 4.6 g/dL (ref 3.5–5.2)
Alkaline Phosphatase: 40 U/L (ref 39–117)
BUN: 11 mg/dL (ref 6–23)
CO2: 28 mEq/L (ref 19–32)
Calcium: 8.7 mg/dL (ref 8.4–10.5)
Chloride: 106 mEq/L (ref 96–112)
Creatinine, Ser: 0.57 mg/dL (ref 0.40–1.20)
GFR: 119.85 mL/min (ref >60.00)
Glucose, Bld: 83 mg/dL (ref 70–99)
Potassium: 3.9 mEq/L (ref 3.5–5.1)
Sodium: 139 mEq/L (ref 135–145)
Total Bilirubin: 0.4 mg/dL (ref 0.2–1.2)
Total Protein: 7.2 g/dL (ref 6.0–8.3)

## 2020-05-04 LAB — VITAMIN D 25 HYDROXY (VIT D DEFICIENCY, FRACTURES): VITD: 22.67 ng/mL — ABNORMAL LOW (ref 30.00–100.00)

## 2020-05-04 LAB — TSH: TSH: 1.86 u[IU]/mL (ref 0.35–4.50)

## 2020-05-04 NOTE — Patient Instructions (Addendum)
Get back into the habit of vitamin D daily  Try wrist splints at night for carpal tunnel   You can see Dr Lorelei Pont for knee pain in the future (sports med)   Labs today   Stay active

## 2020-05-04 NOTE — Progress Notes (Signed)
Subjective:    Patient ID: Margaret Schwartz, female    DOB: [birth year], 34 y.o.   MRN: [id number]  This visit occurred during the SARS-CoV-2 public health emergency.  Safety protocols were in place, including screening questions prior to the visit, additional usage of staff PPE, and extensive cleaning of exam room while observing appropriate contact time as indicated for disinfecting solutions.    HPI Here for health maintenance exam and to review chronic medical problems    Wt Readings from Last 3 Encounters:  05/04/20 117 lb 1 oz (53.1 kg)  02/03/19 113 lb (51.3 kg)  01/18/19 115 lb 8 oz (52.4 kg)   21.07 kg/m   covid status -had vaccine     Thinks she may have also had covid    Pap 4/18 -sees gyn ([place])  Is due for a trip back  Menses-more normal cycle now , but still heavy  They tend to be long -spots before and after  Contraception -none currently  Not desiring pregnancy- may consider vasectomy    Self breast exam - no lumps   Flu shot 10/20 Tdap 2/18  Neg HIV screen in the past   H/o vit D def- will do labs today  Does not take D regularly    H/o raynauds - over the winter had episodes   Needs labs today  R knee gets stiff sometimes after sitting/still Her hands get numb at night   Patient Active Problem List   Diagnosis Date Noted  . Palpitations 01/18/2019  . Chest pain 01/18/2019  . Raynaud's phenomenon without gangrene 01/17/2019  . Vitamin D deficiency 04/02/2018  . Routine general medical examination at a health care facility 07/17/2015  . 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 Allergy   . Frequent headaches    Past Surgical History:  Procedure Laterality Date  . NASAL SINUS SURGERY  2007   turbinate reduction  . TONSILLECTOMY AND ADENOIDECTOMY  2007  . VAGINAL DELIVERY  2013   friable cervix with bleeding during pregnancy, anemia after pregnancy  . WISDOM TOOTH  EXTRACTION     Social History   Tobacco Use  . Smoking status: Never Smoker  . Smokeless tobacco: Never Used  Substance Use Topics  . Alcohol use: No  . Drug use: No   Family History  Problem Relation Age of Onset  . Heart disease Mother   . Hyperlipidemia Mother   . Hypertension Mother   . Alcohol abuse Father   . Drug abuse Father   . Heart disease Father   . Hypertension Father   . Cancer Maternal Uncle 48       NHL  . Arthritis Maternal Grandmother   . Diabetes Maternal Grandmother   . Arthritis Maternal Grandfather   . Cancer Maternal Grandfather        Lung cancer  . Heart disease Maternal Grandfather   . Stroke Maternal Grandfather   . Hypertension Maternal Grandfather    Allergies  Allergen Reactions  . Vicodin [Hydrocodone-Acetaminophen] Nausea And Vomiting   Current Outpatient Medications on File Prior to Visit  Medication Sig Dispense Refill  . Albuterol Sulfate (PROAIR RESPICLICK) 450 (90 Base) MCG/ACT AEPB Inhale 1 Dose into the lungs 3 (three) times daily as needed. 1 each 0  . Azelastine-Fluticasone 137-50 MCG/ACT SUSP 1 spray to each nostril every 12 hours as needed 1 Bottle 3  . propranolol (INDERAL) 10 MG tablet Take 1  tablet (10 mg total) by mouth 3 (three) times daily as needed (for palpitations and/or tachycardia). 30 tablet 1   No current facility-administered medications on file prior to visit.    Review of Systems  Constitutional: Negative for activity change, appetite change, fatigue, fever and unexpected weight change.  HENT: Negative for congestion, ear pain, rhinorrhea, sinus pressure and sore throat.   Eyes: Negative for pain, redness and visual disturbance.  Respiratory: Negative for cough, shortness of breath and wheezing.   Cardiovascular: Negative for chest pain, palpitations and leg swelling.  Gastrointestinal: Negative for abdominal pain, blood in stool, constipation and diarrhea.  Endocrine: Negative for polydipsia and polyuria.    Genitourinary: Negative for dysuria, frequency and urgency.  Musculoskeletal: Positive for arthralgias. Negative for back pain and myalgias.  Skin: Negative for pallor and rash.  Allergic/Immunologic: Negative for environmental allergies.  Neurological: Negative for dizziness, syncope and headaches.       Occ numb hands at night in bed  Hematological: Negative for adenopathy. Does not bruise/bleed easily.  Psychiatric/Behavioral: Negative for decreased concentration and dysphoric mood. The patient is not nervous/anxious.        Objective:   Physical Exam Constitutional:      General: She is not in acute distress.    Appearance: Normal appearance. She is well-developed and normal weight. She is not ill-appearing or diaphoretic.  HENT:     Head: Normocephalic and atraumatic.     Right Ear: Tympanic membrane, ear canal and external ear normal.     Left Ear: Tympanic membrane, ear canal and external ear normal.     Nose: Nose normal. No congestion.     Mouth/Throat:     Mouth: Mucous membranes are moist.     Pharynx: Oropharynx is clear. No posterior oropharyngeal erythema.  Eyes:     General: No scleral icterus.    Extraocular Movements: Extraocular movements intact.     Conjunctiva/sclera: Conjunctivae normal.     Pupils: Pupils are equal, round, and reactive to light.  Neck:     Thyroid: No thyromegaly.     Vascular: No carotid bruit or JVD.  Cardiovascular:     Rate and Rhythm: Normal rate and regular rhythm.     Pulses: Normal pulses.     Heart sounds: Normal heart sounds. No gallop.   Pulmonary:     Effort: Pulmonary effort is normal. No respiratory distress.     Breath sounds: Normal breath sounds. No wheezing.     Comments: Good air exch Chest:     Chest wall: No tenderness.  Abdominal:     General: Bowel sounds are normal. There is no distension or abdominal bruit.     Palpations: Abdomen is soft. There is no mass.     Tenderness: There is no abdominal tenderness.      Hernia: No hernia is present.  Genitourinary:    Comments: Breast and pelvic exam done by gyn    Musculoskeletal:        General: No tenderness. Normal range of motion.     Cervical back: Normal range of motion and neck supple. No rigidity. No muscular tenderness.     Right lower leg: No edema.     Left lower leg: No edema.  Lymphadenopathy:     Cervical: No cervical adenopathy.  Skin:    General: Skin is warm and dry.     Coloration: Skin is not pale.     Findings: No erythema or rash.     Comments:  Solar lentigines diffusely   Neurological:     Mental Status: She is alert. Mental status is at baseline.     Cranial Nerves: No cranial nerve deficit.     Motor: No abnormal muscle tone.     Coordination: Coordination normal.     Gait: Gait normal.     Deep Tendon Reflexes: Reflexes are normal and symmetric. Reflexes normal.  Psychiatric:        Mood and Affect: Mood normal.        Cognition and Memory: Cognition and memory normal.           Assessment & Plan:   Problem List Items Addressed This Visit      Other   Routine general medical examination at a health care facility - Primary    Reviewed health habits including diet and exercise and skin cancer prevention Reviewed appropriate screening tests for age  Also reviewed health mt list, fam hx and immunization status , as well as social and family history   See HPI Labs ordered for wellness  covid immunized  Reviewed some joint complaints-to come back for eval  utd gyn care - planning a visit  No changes in self breast exam      Relevant Orders   CBC with Differential/Platelet (Completed)   Comprehensive metabolic panel (Completed)   Lipid panel (Completed)   TSH (Completed)   Vitamin D deficiency    Pt is not taking her vit D regularly  Disc imp to bone and overall health  Level today      Relevant Orders   VITAMIN D 25 Hydroxy (Vit-D Deficiency, Fractures) (Completed)   Raynaud's phenomenon without  gangrene    Fairly stable Makes effort to keep feet and hands warm

## 2020-05-05 NOTE — Assessment & Plan Note (Signed)
Pt is not taking her vit D regularly  Disc imp to bone and overall health  Level today

## 2020-05-05 NOTE — Assessment & Plan Note (Signed)
Fairly stable Makes effort to keep feet and hands warm

## 2020-05-05 NOTE — Assessment & Plan Note (Signed)
Reviewed health habits including diet and exercise and skin cancer prevention Reviewed appropriate screening tests for age  Also reviewed health mt list, fam hx and immunization status , as well as social and family history   See HPI Labs ordered for wellness  covid immunized  Reviewed some joint complaints-to come back for eval  utd gyn care - planning a visit  No changes in self breast exam

## 2020-05-07 ENCOUNTER — Telehealth: Payer: Self-pay | Admitting: *Deleted

## 2020-05-07 NOTE — Telephone Encounter (Signed)
Addressed through lab results  

## 2020-05-07 NOTE — Telephone Encounter (Signed)
Left VM requesting pt to call the office back regarding lab results  

## 2020-08-06 ENCOUNTER — Telehealth: Payer: Self-pay | Admitting: Family Medicine

## 2020-08-06 DIAGNOSIS — E559 Vitamin D deficiency, unspecified: Secondary | ICD-10-CM

## 2020-08-06 NOTE — Telephone Encounter (Signed)
-----   Message from Ellamae Sia sent at 07/25/2020  2:51 PM EDT ----- Regarding: Lab orders for Tuesday, 9.21.21 Lab orders, VIT D?

## 2020-08-07 ENCOUNTER — Other Ambulatory Visit: Payer: No Typology Code available for payment source

## 2021-04-15 ENCOUNTER — Encounter: Payer: Self-pay | Admitting: Emergency Medicine

## 2021-04-15 ENCOUNTER — Ambulatory Visit
Admission: EM | Admit: 2021-04-15 | Discharge: 2021-04-15 | Disposition: A | Discharge status: Home / Self Care | Payer: No Typology Code available for payment source | Attending: Family Medicine | Admitting: Family Medicine

## 2021-04-15 ENCOUNTER — Other Ambulatory Visit: Payer: Self-pay

## 2021-04-15 DIAGNOSIS — Z20822 Contact with and (suspected) exposure to covid-19: Secondary | ICD-10-CM

## 2021-04-15 DIAGNOSIS — B349 Viral infection, unspecified: Secondary | ICD-10-CM

## 2021-04-15 MED ORDER — ALBUTEROL SULFATE HFA 108 (90 BASE) MCG/ACT IN AERS
2.0000 | INHALATION_SPRAY | Freq: Four times a day (QID) | RESPIRATORY_TRACT | Status: DC
  Administered 2021-04-15: 2 via RESPIRATORY_TRACT

## 2021-04-15 MED ORDER — PROMETHAZINE-DM 6.25-15 MG/5ML PO SYRP: 5.0000 mL | ORAL_SOLUTION | Freq: Four times a day (QID) | ORAL | 0 refills | Status: DC | PRN

## 2021-04-15 MED ORDER — LEVOCETIRIZINE DIHYDROCHLORIDE 5 MG PO TABS: 5.0000 mg | ORAL_TABLET | Freq: Every evening | ORAL | 0 refills | Status: DC

## 2021-04-15 NOTE — ED Triage Notes (Signed)
States she coughed up a little blood this morning.  States she has been having leg pain for the past few days and pain all over.  States everyone in her house is covid positive.  Also c/o sore throat

## 2021-04-15 NOTE — Discharge Instructions (Signed)
Your COVID 19/Flu  results should result within 3-4 days. Negative results are immediately resulted to Mychart. Positive results will receive a follow-up call from our clinic. If symptoms are present, I recommend home quarantine until results are known.  Alternate Tylenol and ibuprofen as needed for body aches and fever.  Symptom management per recommendations discussed today.  If any breathing difficulty or chest pain develops go immediately to the closest emergency department for evaluation.

## 2021-04-15 NOTE — ED Provider Notes (Signed)
[facility]    CSN: [id number] Arrival date & time: 04/15/21  2671      History   Chief Complaint No chief complaint on file.   HPI Margaret Schwartz is a 35 y.o. female.   HPI  Patient presents today for evaluation following a COVID-19 exposure.  Patient endorses cough which with production and traces of blood.  She also endorses body aches along with sore throat.  Reports everyone in her household has tested positive for COVID.  She is in for testing today.  She has a low-grade fever. She has taken her home COVID test which was negative.  Past Medical History:  Diagnosis Date  . Allergy   . Frequent headaches     Patient Active Problem List   Diagnosis Date Noted  . Palpitations 01/18/2019  . Chest pain 01/18/2019  . Raynaud's phenomenon without gangrene 01/17/2019  . Vitamin D deficiency 04/02/2018  . Routine general medical examination at a health care facility 07/17/2015  . 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 NASAL SINUS SURGERY  2007   turbinate reduction  . TONSILLECTOMY AND ADENOIDECTOMY  2007  . VAGINAL DELIVERY  2013   friable cervix with bleeding during pregnancy, anemia after pregnancy  . WISDOM TOOTH EXTRACTION      OB History    Gravida  3   Para  2   Term  2   Preterm      AB      Living  2     SAB      IAB      Ectopic      Multiple  0   Live Births  2            Home Medications    Prior to Admission medications   Medication Sig Start Date End Date Taking? Authorizing Provider  Albuterol Sulfate (PROAIR RESPICLICK) 245 (90 Base) MCG/ACT AEPB Inhale 1 Dose into the lungs 3 (three) times daily as needed. 02/03/19   Venia Carbon, MD  Azelastine-Fluticasone [phone] MCG/ACT SUSP 1 spray to each nostril every 12 hours as needed 02/15/19   Tower, Wynelle Fanny, MD  propranolol (INDERAL) 10 MG tablet Take 1 tablet (10 mg total) by  mouth 3 (three) times daily as needed (for palpitations and/or tachycardia). 03/04/19   Minna Merritts, MD    Family History Family History  Problem Relation Age of Onset  . Heart disease Mother   . Hyperlipidemia Mother   . Hypertension Mother   . Alcohol abuse Father   . Drug abuse Father   . Heart disease Father   . Hypertension Father   . Cancer Maternal Uncle 48       NHL  . Arthritis Maternal Grandmother   . Diabetes Maternal Grandmother   . Arthritis Maternal Grandfather   . Cancer Maternal Grandfather        Lung cancer  . Heart disease Maternal Grandfather   . Stroke Maternal Grandfather   . Hypertension Maternal Grandfather     Social History Social History   Tobacco Use  . Smoking status: Never Smoker  . Smokeless tobacco: Never Used  Substance Use Topics  . Alcohol use: No  . Drug use: No     Allergies   Vicodin [hydrocodone-acetaminophen]   Review of Systems Review of Systems Pertinent negatives listed in HPI  Physical Exam Triage Vital Signs ED Triage Vitals  Enc Vitals Group     BP 04/15/21 0823 128/82     Pulse Rate 04/15/21 0823 (!) 125     Resp 04/15/21 0823 18     Temp 04/15/21 0823 99.2 F (37.3 C)     Temp Source 04/15/21 0823 Oral     SpO2 04/15/21 0823 98 %     Weight --      Height --      Head Circumference --      Peak Flow --      Pain Score 04/15/21 0827 5     Pain Loc --      Pain Edu? --      Excl. in [place]? --    No data found.  Updated Vital Signs BP 128/82   Pulse (!) 125   Temp 99.2 F (37.3 C) (Oral)   Resp 18   LMP 04/09/2021   SpO2 98%   Visual Acuity Right Eye Distance:   Left Eye Distance:   Bilateral Distance:    Right Eye Near:   Left Eye Near:    Bilateral Near:     Physical Exam  General Appearance:    Alert, acutely ill-appearing, cooperative, no distress  HENT:   Normocephalic, ears normal, nares mucosal edema with congestion, rhinorrhea, oropharynx irritated/dry  Eyes:    PERRL,  conjunctiva/corneas clear, EOM's intact       Lungs:     Clear to auscultation bilaterally, respirations unlabored  Heart:    Regular rate and rhythm  Neurologic:   Awake, alert, oriented x 3. No apparent focal neurological           defect.      UC Treatments / Results  Labs (all labs ordered are listed, but only abnormal results are displayed) Labs Reviewed  COVID-19, FLU A+B NAA    EKG   Radiology No results found.  Procedures Procedures (including critical care time)  Medications Ordered in UC Medications - No data to display  Initial Impression / Assessment and Plan / UC Course  I have reviewed the triage vital signs and the nursing notes.  Pertinent labs & imaging results that were available during my care of the patient were reviewed by me and considered in my medical decision making (see chart for details).    Exposure to [place] flu test pending. Symptom management warranted only.  Manage fever with Tylenol and ibuprofen.  Nasal symptoms with over-the-counter antihistamines recommended.  Treatment per discharge medications/discharge instructions.  Red flags/ER precautions given. The most current CDC isolation/quarantine recommendation advised.  Final Clinical Impressions(s) / UC Diagnoses   Final diagnoses:  Exposure to COVID-19 virus  Viral illness   Discharge Instructions   None    ED Prescriptions    None     PDMP not reviewed this encounter.   Scot Jun, [place] 04/15/21 [phone]

## 2021-04-17 LAB — COVID-19, FLU A+B NAA
Influenza A, NAA: NOT DETECTED
Influenza B, NAA: NOT DETECTED
SARS-CoV-2, NAA: DETECTED — AB

## 2022-04-04 ENCOUNTER — Encounter: Payer: Self-pay | Admitting: Family Medicine

## 2022-04-04 DIAGNOSIS — Z0000 Encounter for general adult medical examination without abnormal findings: Secondary | ICD-10-CM

## 2022-04-04 DIAGNOSIS — E559 Vitamin D deficiency, unspecified: Secondary | ICD-10-CM

## 2022-04-04 NOTE — Telephone Encounter (Signed)
Lab appt cancelled for our office. See mychart message. Please put lab orders in for other [place] office to draw

## 2022-04-07 NOTE — Telephone Encounter (Signed)
I put the orders in - do I need to do anything to specify which lab?

## 2022-04-09 ENCOUNTER — Other Ambulatory Visit (INDEPENDENT_AMBULATORY_CARE_PROVIDER_SITE_OTHER): Payer: No Typology Code available for payment source

## 2022-04-09 DIAGNOSIS — Z0000 Encounter for general adult medical examination without abnormal findings: Secondary | ICD-10-CM

## 2022-04-09 DIAGNOSIS — E559 Vitamin D deficiency, unspecified: Secondary | ICD-10-CM

## 2022-04-09 LAB — COMPREHENSIVE METABOLIC PANEL
ALT: 11 U/L (ref 0–35)
AST: 11 U/L (ref 0–37)
Albumin: 4.4 g/dL (ref 3.5–5.2)
Alkaline Phosphatase: 45 U/L (ref 39–117)
BUN: 8 mg/dL (ref 6–23)
CO2: 27 mEq/L (ref 19–32)
Calcium: 8.9 mg/dL (ref 8.4–10.5)
Chloride: 106 mEq/L (ref 96–112)
Creatinine, Ser: 0.6 mg/dL (ref 0.40–1.20)
GFR: 114.06 mL/min (ref >60.00)
Glucose, Bld: 88 mg/dL (ref 70–99)
Potassium: 4.2 mEq/L (ref 3.5–5.1)
Sodium: 139 mEq/L (ref 135–145)
Total Bilirubin: 0.4 mg/dL (ref 0.2–1.2)
Total Protein: 6.6 g/dL (ref 6.0–8.3)

## 2022-04-09 LAB — CBC WITH DIFFERENTIAL/PLATELET
Basophils Absolute: 0 10*3/uL (ref 0.0–0.1)
Basophils Relative: 0.3 % (ref 0.0–3.0)
Eosinophils Absolute: 0.2 10*3/uL (ref 0.0–0.7)
Eosinophils Relative: 2.1 % (ref 0.0–5.0)
HCT: 38.5 % (ref 36.0–46.0)
Hemoglobin: 13 g/dL (ref 12.0–15.0)
Lymphocytes Relative: 13.2 % (ref 12.0–46.0)
Lymphs Abs: 1 10*3/uL (ref 0.7–4.0)
MCHC: 33.9 g/dL (ref 30.0–36.0)
MCV: 86.8 fl (ref 78.0–100.0)
Monocytes Absolute: 0.7 10*3/uL (ref 0.1–1.0)
Monocytes Relative: 9.1 % (ref 3.0–12.0)
Neutro Abs: 5.9 10*3/uL (ref 1.4–7.7)
Neutrophils Relative %: 75.3 % (ref 43.0–77.0)
Platelets: 229 10*3/uL (ref 150.0–400.0)
RBC: 4.43 Mil/uL (ref 3.87–5.11)
RDW: 13.3 % (ref 11.5–15.5)
WBC: 7.9 10*3/uL (ref 4.0–10.5)

## 2022-04-09 LAB — TSH: TSH: 1.75 u[IU]/mL (ref 0.35–5.50)

## 2022-04-09 LAB — LIPID PANEL
Cholesterol: 126 mg/dL (ref 0–200)
HDL: 55.8 mg/dL (ref >39.00)
LDL Cholesterol: 62 mg/dL (ref 0–99)
NonHDL: 70.2
Total CHOL/HDL Ratio: 2
Triglycerides: 39 mg/dL (ref 0.0–149.0)
VLDL: 7.8 mg/dL (ref 0.0–40.0)

## 2022-04-09 LAB — VITAMIN D 25 HYDROXY (VIT D DEFICIENCY, FRACTURES): VITD: 38.77 ng/mL (ref 30.00–100.00)

## 2022-04-11 ENCOUNTER — Telehealth: Payer: No Typology Code available for payment source | Admitting: Physician Assistant

## 2022-04-11 DIAGNOSIS — J208 Acute bronchitis due to other specified organisms: Secondary | ICD-10-CM | POA: Diagnosis not present

## 2022-04-11 DIAGNOSIS — B9689 Other specified bacterial agents as the cause of diseases classified elsewhere: Secondary | ICD-10-CM

## 2022-04-11 MED ORDER — AZITHROMYCIN 250 MG PO TABS: ORAL_TABLET | ORAL | 0 refills | Status: AC

## 2022-04-11 MED ORDER — BENZONATATE 100 MG PO CAPS: 100.0000 mg | ORAL_CAPSULE | Freq: Three times a day (TID) | ORAL | 0 refills | Status: DC | PRN

## 2022-04-11 NOTE — Progress Notes (Signed)

## 2022-04-11 NOTE — Progress Notes (Signed)
I have spent 5 minutes in review of e-visit questionnaire, review and updating patient chart, medical decision making and response to patient.   Dimitris Shanahan Cody Cheyanna Strick, PA-C    

## 2022-04-18 ENCOUNTER — Other Ambulatory Visit: Payer: No Typology Code available for payment source

## 2022-04-21 ENCOUNTER — Other Ambulatory Visit (HOSPITAL_COMMUNITY): Payer: Self-pay

## 2022-04-21 ENCOUNTER — Encounter: Payer: Self-pay | Admitting: Family Medicine

## 2022-04-21 ENCOUNTER — Ambulatory Visit (INDEPENDENT_AMBULATORY_CARE_PROVIDER_SITE_OTHER): Payer: No Typology Code available for payment source | Admitting: Family Medicine

## 2022-04-21 VITALS — BP 110/68 | HR 98 | Ht 62.0 in | Wt 113.0 lb

## 2022-04-21 DIAGNOSIS — Z23 Encounter for immunization: Secondary | ICD-10-CM | POA: Diagnosis not present

## 2022-04-21 DIAGNOSIS — Z0000 Encounter for general adult medical examination without abnormal findings: Secondary | ICD-10-CM

## 2022-04-21 DIAGNOSIS — E559 Vitamin D deficiency, unspecified: Secondary | ICD-10-CM | POA: Diagnosis not present

## 2022-04-21 DIAGNOSIS — L700 Acne vulgaris: Secondary | ICD-10-CM

## 2022-04-21 MED ORDER — TRETINOIN 0.05 % EX CREA
TOPICAL_CREAM | Freq: Every day | CUTANEOUS | 2 refills | Status: DC
  Filled 2022-04-21: qty 45, 30d supply, fill #0

## 2022-04-21 NOTE — Patient Instructions (Addendum)
Please have your gyn send a copy of your pap  Try and get 2000 iu of vitamin D3 daily   Take care of yourself  Use sunscreen   Tdap vaccine today   Try the retin a at night as directed  If not improved in 3 months let us know

## 2022-04-21 NOTE — Assessment & Plan Note (Signed)
Acute on chronic  No longer wants to take OC Has failed otc differin Dislikes benzoyl peroxide   Px retin a 0.05 % daily  Reviewed how to take /use  Also disc sunscreen and other products If not improved in 3 months will reach out

## 2022-04-21 NOTE — Assessment & Plan Note (Signed)
Reviewed health habits including diet and exercise and skin cancer prevention Reviewed appropriate screening tests for age  Also reviewed health mt list, fam hx and immunization status , as well as social and family history   See HPI Tdap updated Gyn visit soon for pap  Does self breast exams  Labs reviewed  Good health habits

## 2022-04-21 NOTE — Assessment & Plan Note (Signed)
Low nl D level  inst to make sure she gets 2000 iu dialy

## 2022-04-21 NOTE — Progress Notes (Signed)
Subjective:    Patient ID: Margaret Schwartz, female    DOB: 02/03/1986, 36 y.o.   MRN: [id number]  HPI Here for health maintenance exam and to review chronic medical problems    Wt Readings from Last 3 Encounters:  04/21/22 113 lb (51.3 kg)  05/04/20 117 lb 1 oz (53.1 kg)  02/03/19 113 lb (51.3 kg)   20.67 kg/m  Has acne issues  (Better briefly when had abx for sinus infection)   Used adapaline - helps with cysts  Wants to try retin a   Does not like benzoyl peroxide- bleaches out everything   In the past-OC helped  Really helped  Would rather not take hormones   Body acne is better now that she is not nursing    No plans to get pregnant again  Husband had a vasectomy   Very good about sunscreen    Immunization History  Administered Date(s) Administered   Influenza,inj,Quad PF,6+ Mos 07/11/2014, 08/26/2019   Pfizer Covid-19 Vaccine Bivalent Booster 92yr & up 08/15/2021   Tdap 07/17/2011, 01/01/2017  Will update Tdap   Pap 02/2017 - gyn Sees Dr [facility] Has pap is scheduled soon   Menses: normal  Regular Not too heavy and painful   Self breast exam : no lumps   Neg HIV screen in the past   No worries about hep C    H/o palpitations Has taken propranolol from cardiology in the past  Much less often- with less caffeine  Occ happens around her period with sweats at night   BP Readings from Last 3 Encounters:  04/21/22 110/68  04/15/21 128/82  05/04/20 118/78   Pulse Readings from Last 3 Encounters:  04/21/22 98  04/15/21 (!) 125  05/04/20 86    H/o NTHFR mutation found by gyn [place] [place] 38.77 Takes a hair and skin vitamin    Glucose 88 fasting   Cholesterol Lab Results  Component Value Date   CHOL 126 04/09/2022   CHOL 140 05/04/2020   CHOL 127 03/31/2018   Lab Results  Component Value Date   HDL 55.80 04/09/2022   HDL 56.20 05/04/2020   HDL 57.40 03/31/2018   Lab Results   Component Value Date   LDLCALC 62 04/09/2022   LDLCALC 76 05/04/2020   LDLCALC 63 03/31/2018   Lab Results  Component Value Date   TRIG 39.0 04/09/2022   TRIG 38.0 05/04/2020   TRIG 36.0 03/31/2018   Lab Results  Component Value Date   CHOLHDL 2 04/09/2022   CHOLHDL 2 05/04/2020   CHOLHDL 2 03/31/2018   No results found for: LDLDIRECT Tries to eat well  Is mindful of junk food /moderation  Fruits and veggies  Good fats     Other labs Lab Results  Component Value Date   WBC 7.9 04/09/2022   HGB 13.0 04/09/2022   HCT 38.5 04/09/2022   MCV 86.8 04/09/2022   PLT 229.0 04/09/2022   Lab Results  Component Value Date   TSH 1.75 04/09/2022    Lab Results  Component Value Date   CREATININE 0.60 04/09/2022   BUN 8 04/09/2022   NA 139 04/09/2022   K 4.2 04/09/2022   CL 106 04/09/2022   CO2 27 04/09/2022   Lab Results  Component Value Date   ALT 11 04/09/2022   AST 11 04/09/2022   ALKPHOS 45 04/09/2022   BILITOT 0.4 04/09/2022  Patient Active Problem List   Diagnosis Date Noted   Acne vulgaris 04/21/2022   Palpitations 01/18/2019   Raynaud's phenomenon without gangrene 01/17/2019   Vitamin D deficiency 04/02/2018   Routine general medical examination at a health care facility 07/17/2015   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   Past Medical History:  Diagnosis Date   Allergy    Frequent headaches    Past Surgical History:  Procedure Laterality Date   NASAL SINUS SURGERY  2007   turbinate reduction   TONSILLECTOMY AND ADENOIDECTOMY  2007   VAGINAL DELIVERY  2013   friable cervix with bleeding during pregnancy, anemia after pregnancy   WISDOM TOOTH EXTRACTION     Social History   Tobacco Use   Smoking status: Never   Smokeless tobacco: Never  Vaping Use   Vaping Use: Never used  Substance Use Topics   Alcohol use: No   Drug use: No   Family History  Problem Relation Age of Onset   Heart disease Mother     Hyperlipidemia Mother    Hypertension Mother    Alcohol abuse Father    Drug abuse Father    Heart disease Father    Hypertension Father    Cancer Maternal Uncle 39       NHL   Arthritis Maternal Grandmother    Diabetes Maternal Grandmother    Arthritis Maternal Grandfather    Cancer Maternal Grandfather        Lung cancer   Heart disease Maternal Grandfather    Stroke Maternal Grandfather    Hypertension Maternal Grandfather    Allergies  Allergen Reactions   Vicodin [Hydrocodone-Acetaminophen] Nausea And Vomiting   Current Outpatient Medications on File Prior to Visit  Medication Sig Dispense Refill   Albuterol Sulfate (PROAIR RESPICLICK) 209 (90 Base) MCG/ACT AEPB Inhale 1 Dose into the lungs 3 (three) times daily as needed. (Patient not taking: Reported on 04/21/2022) 1 each 0   Azelastine-Fluticasone 137-50 MCG/ACT SUSP 1 spray to each nostril every 12 hours as needed (Patient not taking: Reported on 04/21/2022) 1 Bottle 3   levocetirizine (XYZAL) 5 MG tablet Take 1 tablet (5 mg total) by mouth every evening. (Patient not taking: Reported on 04/21/2022) 30 tablet 0   propranolol (INDERAL) 10 MG tablet Take 1 tablet (10 mg total) by mouth 3 (three) times daily as needed (for palpitations and/or tachycardia). (Patient not taking: Reported on 04/21/2022) 30 tablet 1   No current facility-administered medications on file prior to visit.      Review of Systems  Constitutional:  Negative for activity change, appetite change, fatigue, fever and unexpected weight change.  HENT:  Negative for congestion, ear pain, rhinorrhea, sinus pressure and sore throat.   Eyes:  Negative for pain, redness and visual disturbance.  Respiratory:  Negative for cough, shortness of breath and wheezing.   Cardiovascular:  Negative for chest pain and palpitations.  Gastrointestinal:  Negative for abdominal pain, blood in stool, constipation and diarrhea.  Endocrine: Negative for polydipsia and polyuria.   Genitourinary:  Negative for dysuria, frequency and urgency.  Musculoskeletal:  Negative for arthralgias, back pain and myalgias.  Skin:  Negative for pallor and rash.  Allergic/Immunologic: Negative for environmental allergies.  Neurological:  Negative for dizziness, syncope and headaches.  Hematological:  Negative for adenopathy. Does not bruise/bleed easily.  Psychiatric/Behavioral:  Negative for decreased concentration and dysphoric mood. The patient is not nervous/anxious.       Objective:   Physical Exam  Constitutional:      General: She is not in acute distress.    Appearance: Normal appearance. She is well-developed and normal weight. She is not ill-appearing or diaphoretic.  HENT:     Head: Normocephalic and atraumatic.     Right Ear: Tympanic membrane, ear canal and external ear normal.     Left Ear: Tympanic membrane, ear canal and external ear normal.     Nose: Nose normal. No congestion.     Mouth/Throat:     Mouth: Mucous membranes are moist.     Pharynx: Oropharynx is clear. No posterior oropharyngeal erythema.  Eyes:     General: No scleral icterus.    Extraocular Movements: Extraocular movements intact.     Conjunctiva/sclera: Conjunctivae normal.     Pupils: Pupils are equal, round, and reactive to light.  Neck:     Thyroid: No thyromegaly.     Vascular: No carotid bruit or JVD.  Cardiovascular:     Rate and Rhythm: Normal rate and regular rhythm.     Pulses: Normal pulses.     Heart sounds: Normal heart sounds.    No gallop.  Pulmonary:     Effort: Pulmonary effort is normal. No respiratory distress.     Breath sounds: Normal breath sounds. No wheezing.     Comments: Good air exch Chest:     Chest wall: No tenderness.  Abdominal:     General: Bowel sounds are normal. There is no distension or abdominal bruit.     Palpations: Abdomen is soft. There is no mass.     Tenderness: There is no abdominal tenderness.     Hernia: No hernia is present.   Genitourinary:    Comments: Breast exam: No mass, nodules, thickening, tenderness, bulging, retraction, inflamation, nipple discharge or skin changes noted.  No axillary or clavicular LA.     Musculoskeletal:        General: No tenderness. Normal range of motion.     Cervical back: Normal range of motion and neck supple. No rigidity. No muscular tenderness.     Right lower leg: No edema.     Left lower leg: No edema.     Comments: No kyphosis   Lymphadenopathy:     Cervical: No cervical adenopathy.  Skin:    General: Skin is warm and dry.     Coloration: Skin is not pale.     Findings: No erythema or rash.     Comments: Mild comedonal acne on chin and forehead No pustules No erythema  Back/chest are clear  Solar lentigines diffusely   Neurological:     Mental Status: She is alert. Mental status is at baseline.     Cranial Nerves: No cranial nerve deficit.     Motor: No abnormal muscle tone.     Coordination: Coordination normal.     Gait: Gait normal.     Deep Tendon Reflexes: Reflexes are normal and symmetric. Reflexes normal.  Psychiatric:        Mood and Affect: Mood normal.        Cognition and Memory: Cognition and memory normal.          Assessment & Plan:   Problem List Items Addressed This Visit       Musculoskeletal and Integument   Acne vulgaris    Acute on chronic  No longer wants to take OC Has failed otc differin Dislikes benzoyl peroxide   Px retin a 0.05 % daily  Reviewed how to take /  use  Also disc sunscreen and other products If not improved in 3 months will reach out         Relevant Medications   tretinoin (RETIN-A) 0.05 % cream     Other   Routine general medical examination at a health care facility - Primary    Reviewed health habits including diet and exercise and skin cancer prevention Reviewed appropriate screening tests for age  Also reviewed health mt list, fam hx and immunization status , as well as social and family history    See HPI Tdap updated Gyn visit soon for pap  Does self breast exams  Labs reviewed  Good health habits        Vitamin D deficiency    Low nl D level  inst to make sure she gets 2000 iu dialy        Other Visit Diagnoses     Need for Tdap vaccination       Relevant Orders   Tdap vaccine greater than or equal to 7yo IM (Completed)

## 2022-04-22 ENCOUNTER — Other Ambulatory Visit (HOSPITAL_COMMUNITY): Payer: Self-pay

## 2022-05-06 ENCOUNTER — Other Ambulatory Visit (HOSPITAL_COMMUNITY): Payer: Self-pay

## 2022-06-13 LAB — HM PAP SMEAR: HPV, high-risk: NEGATIVE

## 2022-10-26 ENCOUNTER — Telehealth: Payer: No Typology Code available for payment source | Admitting: Physician Assistant

## 2022-10-26 DIAGNOSIS — B379 Candidiasis, unspecified: Secondary | ICD-10-CM | POA: Diagnosis not present

## 2022-10-26 DIAGNOSIS — T3695XA Adverse effect of unspecified systemic antibiotic, initial encounter: Secondary | ICD-10-CM

## 2022-10-26 DIAGNOSIS — R3989 Other symptoms and signs involving the genitourinary system: Secondary | ICD-10-CM

## 2022-10-26 MED ORDER — FLUCONAZOLE 200 MG PO TABS: 200.0000 mg | ORAL_TABLET | Freq: Once | ORAL | 0 refills | Status: AC

## 2022-10-26 MED ORDER — CEPHALEXIN 500 MG PO CAPS: 500.0000 mg | ORAL_CAPSULE | Freq: Two times a day (BID) | ORAL | 0 refills | Status: DC

## 2022-10-26 NOTE — Progress Notes (Signed)
E-Visit for Urinary Problems  We are sorry that you are not feeling well.  Here is how we plan to help!  Based on what you shared with me it looks like you most likely have a simple urinary tract infection.  A UTI (Urinary Tract Infection) is a bacterial infection of the bladder.  Most cases of urinary tract infections are simple to treat but a key part of your care is to encourage you to drink plenty of fluids and watch your symptoms carefully.  I have prescribed Keflex 500 mg twice a day for 7 days.  Your symptoms should gradually improve. Call us if the burning in your urine worsens, you develop worsening fever, back pain or pelvic pain or if your symptoms do not resolve after completing the antibiotic.  I have also prescribed Fluconazole '200mg'$  Take once antibiotic completed  Urinary tract infections can be prevented by drinking plenty of water to keep your body hydrated.  Also be sure when you wipe, wipe from front to back and don't hold it in!  If possible, empty your bladder every 4 hours.  HOME CARE Drink plenty of fluids Compete the full course of the antibiotics even if the symptoms resolve Remember, when you need to go.go. Holding in your urine can increase the likelihood of getting a UTI! GET HELP RIGHT AWAY IF: You cannot urinate You get a high fever Worsening back pain occurs You see blood in your urine You feel sick to your stomach or throw up You feel like you are going to pass out  MAKE SURE YOU  Understand these instructions. Will watch your condition. Will get help right away if you are not doing well or get worse.   Thank you for choosing an e-visit.  Your e-visit answers were reviewed by a board certified advanced clinical practitioner to complete your personal care plan. Depending upon the condition, your plan could have included both over the counter or prescription medications.  Please review your pharmacy choice. Make sure the pharmacy is open so you can  pick up prescription now. If there is a problem, you may contact your provider through [employer] and have the prescription routed to another pharmacy.  Your safety is important to [place]. If you have drug allergies check your prescription carefully.   For the next 24 hours you can use MyChart to ask questions about today's visit, request a non-urgent call back, or ask for a work or school excuse. You will get an email in the next two days asking about your experience. I hope that your e-visit has been valuable and will speed your recovery.  I have spent 5 minutes in review of e-visit questionnaire, review and updating patient chart, medical decision making and response to patient.   Mar Daring, PA-C

## 2022-10-30 ENCOUNTER — Other Ambulatory Visit: Payer: Self-pay | Admitting: Otolaryngology

## 2022-10-30 DIAGNOSIS — E041 Nontoxic single thyroid nodule: Secondary | ICD-10-CM

## 2022-11-03 ENCOUNTER — Ambulatory Visit
Admission: RE | Admit: 2022-11-03 | Discharge: 2022-11-03 | Disposition: A | Discharge status: Home / Self Care | Payer: No Typology Code available for payment source | Source: Ambulatory Visit | Attending: Otolaryngology | Admitting: Otolaryngology

## 2022-11-03 DIAGNOSIS — E041 Nontoxic single thyroid nodule: Secondary | ICD-10-CM

## 2022-11-12 ENCOUNTER — Other Ambulatory Visit: Payer: Self-pay | Admitting: Otolaryngology

## 2022-11-12 DIAGNOSIS — C8590 Non-Hodgkin lymphoma, unspecified, unspecified site: Secondary | ICD-10-CM

## 2022-11-12 DIAGNOSIS — R221 Localized swelling, mass and lump, neck: Secondary | ICD-10-CM

## 2022-11-14 ENCOUNTER — Other Ambulatory Visit: Payer: Self-pay | Admitting: Otolaryngology

## 2022-11-14 DIAGNOSIS — R221 Localized swelling, mass and lump, neck: Secondary | ICD-10-CM

## 2022-11-14 DIAGNOSIS — C8590 Non-Hodgkin lymphoma, unspecified, unspecified site: Secondary | ICD-10-CM

## 2022-11-19 ENCOUNTER — Other Ambulatory Visit: Payer: Self-pay | Admitting: Internal Medicine

## 2022-11-19 DIAGNOSIS — R9389 Abnormal findings on diagnostic imaging of other specified body structures: Secondary | ICD-10-CM

## 2022-11-19 DIAGNOSIS — L659 Nonscarring hair loss, unspecified: Secondary | ICD-10-CM | POA: Diagnosis not present

## 2022-11-19 DIAGNOSIS — H04129 Dry eye syndrome of unspecified lacrimal gland: Secondary | ICD-10-CM | POA: Diagnosis not present

## 2022-11-19 DIAGNOSIS — Z807 Family history of other malignant neoplasms of lymphoid, hematopoietic and related tissues: Secondary | ICD-10-CM | POA: Diagnosis not present

## 2022-11-19 DIAGNOSIS — E041 Nontoxic single thyroid nodule: Secondary | ICD-10-CM | POA: Diagnosis not present

## 2022-11-19 DIAGNOSIS — Z800 Family history of malignant neoplasm of digestive organs: Secondary | ICD-10-CM | POA: Diagnosis not present

## 2022-11-19 DIAGNOSIS — R5383 Other fatigue: Secondary | ICD-10-CM | POA: Diagnosis not present

## 2022-11-19 DIAGNOSIS — Z8349 Family history of other endocrine, nutritional and metabolic diseases: Secondary | ICD-10-CM | POA: Diagnosis not present

## 2022-11-19 DIAGNOSIS — L853 Xerosis cutis: Secondary | ICD-10-CM | POA: Diagnosis not present

## 2022-11-19 DIAGNOSIS — Z8639 Personal history of other endocrine, nutritional and metabolic disease: Secondary | ICD-10-CM | POA: Diagnosis not present

## 2022-11-25 ENCOUNTER — Other Ambulatory Visit (HOSPITAL_COMMUNITY): Payer: Self-pay

## 2022-11-25 DIAGNOSIS — Z800 Family history of malignant neoplasm of digestive organs: Secondary | ICD-10-CM | POA: Diagnosis not present

## 2022-11-25 DIAGNOSIS — R9389 Abnormal findings on diagnostic imaging of other specified body structures: Secondary | ICD-10-CM | POA: Diagnosis not present

## 2022-11-25 DIAGNOSIS — E069 Thyroiditis, unspecified: Secondary | ICD-10-CM | POA: Diagnosis not present

## 2022-11-25 DIAGNOSIS — Z807 Family history of other malignant neoplasms of lymphoid, hematopoietic and related tissues: Secondary | ICD-10-CM | POA: Diagnosis not present

## 2022-11-25 DIAGNOSIS — Z8349 Family history of other endocrine, nutritional and metabolic diseases: Secondary | ICD-10-CM | POA: Diagnosis not present

## 2022-11-25 DIAGNOSIS — E041 Nontoxic single thyroid nodule: Secondary | ICD-10-CM | POA: Diagnosis not present

## 2022-11-25 MED ORDER — PREDNISONE 10 MG PO TABS
ORAL_TABLET | ORAL | 0 refills | Status: AC
  Filled 2022-11-25: qty 50, 20d supply, fill #0

## 2023-01-26 ENCOUNTER — Telehealth: Payer: 59 | Admitting: Nurse Practitioner

## 2023-01-26 ENCOUNTER — Other Ambulatory Visit (HOSPITAL_BASED_OUTPATIENT_CLINIC_OR_DEPARTMENT_OTHER): Payer: Self-pay

## 2023-01-26 DIAGNOSIS — B379 Candidiasis, unspecified: Secondary | ICD-10-CM | POA: Diagnosis not present

## 2023-01-26 DIAGNOSIS — J324 Chronic pansinusitis: Secondary | ICD-10-CM | POA: Diagnosis not present

## 2023-01-26 DIAGNOSIS — T3695XA Adverse effect of unspecified systemic antibiotic, initial encounter: Secondary | ICD-10-CM

## 2023-01-26 MED ORDER — DOXYCYCLINE HYCLATE 100 MG PO TABS
100.0000 mg | ORAL_TABLET | Freq: Two times a day (BID) | ORAL | 0 refills | Status: AC
  Filled 2023-01-26: qty 20, 10d supply, fill #0

## 2023-01-26 MED ORDER — FLUCONAZOLE 150 MG PO TABS
150.0000 mg | ORAL_TABLET | Freq: Once | ORAL | 0 refills | Status: AC
  Filled 2023-01-26: qty 1, 1d supply, fill #0

## 2023-01-26 NOTE — Progress Notes (Signed)
E-Visit for Sinus Problems  We are sorry that you are not feeling well.  Here is how we plan to help!  Based on what you have shared with me it looks like you have sinusitis.  Sinusitis is inflammation and infection in the sinus cavities of the head.  Based on your presentation I believe you most likely have Acute Bacterial Sinusitis.  This is an infection caused by bacteria and is treated with antibiotics. I have prescribed Doxycycline '100mg'$  by mouth twice a day for 10 days. You may use an oral decongestant such as Mucinex D or if you have glaucoma or high blood pressure use plain Mucinex. Saline nasal spray help and can safely be used as often as needed for congestion.  If you develop worsening sinus pain, fever or notice severe headache and vision changes, or if symptoms are not better after completion of antibiotic, please schedule an appointment with a health care provider.    Sinus infections are not as easily transmitted as other respiratory infection, however we still recommend that you avoid close contact with loved ones, especially the very young and elderly.  Remember to wash your hands thoroughly throughout the day as this is the number one way to prevent the spread of infection!  Home Care: Only take medications as instructed by your medical team. Complete the entire course of an antibiotic. Do not take these medications with alcohol. A steam or ultrasonic humidifier can help congestion.  You can place a towel over your head and breathe in the steam from hot water coming from a faucet. Avoid close contacts especially the very young and the elderly. Cover your mouth when you cough or sneeze. Always remember to wash your hands.  Get Help Right Away If: You develop worsening fever or sinus pain. You develop a severe head ache or visual changes. Your symptoms persist after you have completed your treatment plan.  Make sure you Understand these instructions. Will watch your  condition. Will get help right away if you are not doing well or get worse.  Thank you for choosing an e-visit.  Your e-visit answers were reviewed by a board certified advanced clinical practitioner to complete your personal care plan. Depending upon the condition, your plan could have included both over the counter or prescription medications.  Please review your pharmacy choice. Make sure the pharmacy is open so you can pick up prescription now. If there is a problem, you may contact your provider through [employer] and have the prescription routed to another pharmacy.  Your safety is important to [place]. If you have drug allergies check your prescription carefully.   For the next 24 hours you can use MyChart to ask questions about today's visit, request a non-urgent call back, or ask for a work or school excuse. You will get an email in the next two days asking about your experience. I hope that your e-visit has been valuable and will speed your recovery.   Meds ordered this encounter  Medications   doxycycline (VIBRA-TABS) 100 MG tablet    Sig: Take 1 tablet (100 mg total) by mouth 2 (two) times daily for 10 days.    Dispense:  20 tablet    Refill:  0   fluconazole (DIFLUCAN) 150 MG tablet    Sig: Take 1 tablet (150 mg total) by mouth once for 1 dose.    Dispense:  1 tablet    Refill:  0    I spent approximately 5 minutes reviewing  the patient's history, current symptoms and coordinating their care today.

## 2023-02-27 ENCOUNTER — Ambulatory Visit: Payer: 59 | Admitting: Podiatry

## 2023-02-27 ENCOUNTER — Ambulatory Visit (INDEPENDENT_AMBULATORY_CARE_PROVIDER_SITE_OTHER): Payer: 59

## 2023-02-27 ENCOUNTER — Encounter: Payer: Self-pay | Admitting: Podiatry

## 2023-02-27 DIAGNOSIS — M21622 Bunionette of left foot: Secondary | ICD-10-CM

## 2023-02-27 DIAGNOSIS — M7752 Other enthesopathy of left foot: Secondary | ICD-10-CM

## 2023-02-27 DIAGNOSIS — M79672 Pain in left foot: Secondary | ICD-10-CM | POA: Diagnosis not present

## 2023-02-27 MED ORDER — TRIAMCINOLONE ACETONIDE 10 MG/ML IJ SUSP
10.0000 mg | Freq: Once | INTRAMUSCULAR | Status: AC
Start: 2023-02-27 — End: 2023-02-27
  Administered 2023-02-27: 10 mg

## 2023-02-27 NOTE — Progress Notes (Signed)
Subjective:   Patient ID: Margaret Schwartz, female   DOB: 37 y.o.   MRN: [id number]   HPI Patient presents with a lot of pain around the fifth metatarsal left that comes and goes and bruises at the same time.  After that starts then she gets pain around the big toe joint left as she walks differently but it seems that it emanates mostly from the fifth joint left foot.  Does not remember injury does not smoke likes to be active   Review of Systems  All other systems reviewed and are negative.       Objective:  Physical Exam Vitals and nursing note reviewed.  Constitutional:      Appearance: She is well-developed.  Pulmonary:     Effort: Pulmonary effort is normal.  Musculoskeletal:        General: Normal range of motion.  Skin:    General: Skin is warm.  Neurological:     Mental Status: She is alert.     Neurovascular status intact muscle strength adequate range of motion adequate moderate prominence of the fifth metatarsal head left with slight fluid around the joint with some redness around the head itself.  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  Patient does have Raynaud's that could contribute to the irritation she gets but at this point organ to treat as an inflammatory condition I did do sterile prep I injected the fifth MPJ capsule both lateral dorsal and plantar 3 mg Dexasone Kenalog 5 mg Xylocaine and we will see how she responds to this and decide what else might be appropriate depending on response.  Reappoint as needed  X-rays indicate slight prominence but I did not note any significant bone deformity first MPJ looks normal

## 2023-03-03 ENCOUNTER — Other Ambulatory Visit: Payer: Self-pay | Admitting: Podiatry

## 2023-03-03 DIAGNOSIS — M21622 Bunionette of left foot: Secondary | ICD-10-CM

## 2023-03-03 DIAGNOSIS — M7752 Other enthesopathy of left foot: Secondary | ICD-10-CM

## 2023-03-03 DIAGNOSIS — M79672 Pain in left foot: Secondary | ICD-10-CM

## 2023-03-04 ENCOUNTER — Ambulatory Visit: Payer: Self-pay | Admitting: Podiatry

## 2023-04-06 ENCOUNTER — Ambulatory Visit
Admission: RE | Admit: 2023-04-06 | Discharge: 2023-04-06 | Disposition: A | Discharge status: Home / Self Care | Payer: 59 | Source: Ambulatory Visit | Attending: Internal Medicine | Admitting: Internal Medicine

## 2023-04-06 DIAGNOSIS — R9389 Abnormal findings on diagnostic imaging of other specified body structures: Secondary | ICD-10-CM

## 2023-04-06 DIAGNOSIS — E041 Nontoxic single thyroid nodule: Secondary | ICD-10-CM

## 2023-04-09 DIAGNOSIS — E069 Thyroiditis, unspecified: Secondary | ICD-10-CM | POA: Diagnosis not present

## 2023-04-09 DIAGNOSIS — R9389 Abnormal findings on diagnostic imaging of other specified body structures: Secondary | ICD-10-CM | POA: Diagnosis not present

## 2023-04-09 DIAGNOSIS — E041 Nontoxic single thyroid nodule: Secondary | ICD-10-CM | POA: Diagnosis not present

## 2023-04-15 ENCOUNTER — Other Ambulatory Visit: Payer: 59

## 2023-05-28 ENCOUNTER — Other Ambulatory Visit: Payer: 59

## 2023-10-07 NOTE — Progress Notes (Unsigned)
Subjective:    Patient ID: Margaret Schwartz, female    DOB: Jun 15, 1986, 37 y.o.   MRN: [id number]  HPI  Here for health maintenance exam and to review chronic medical problems   Wt Readings from Last 3 Encounters:  10/08/23 119 lb 6 oz (54.1 kg)  04/21/22 113 lb (51.3 kg)  05/04/20 117 lb 1 oz (53.1 kg)   21.49 kg/m  Vitals:   10/08/23 1001  BP: 104/62  Pulse: 72  Temp: 98.1 F (36.7 C)  SpO2: 100%    Immunization History  Administered Date(s) Administered   Influenza, Seasonal, Injecte, Preservative Fre 10/08/2023   Influenza,inj,Quad PF,6+ Mos 07/11/2014, 08/26/2019   Pfizer Covid-19 Vaccine Bivalent Booster 71yrs & up 08/15/2021   Tdap 07/17/2011, 01/01/2017, 04/21/2022    Health Maintenance Due  Topic Date Due   Hepatitis C Screening  Never done   Cervical Cancer Screening (HPV/Pap Cotest)  02/17/2020   Doing well  Taking care of herself   Flu shot - today   Wellness labs today  Hep C screening -wants to do today  Declines HIV screening   Mammogram-pt turns 38 in march Self breast exam-no lumps   Gyn health Pap with gyn recent/ sent for that  Due for follow up (Dr Cherly Hensen)   Has vaginal dryness after menses  Wears pads/no tampon  Irritation but no itching   Husb had a vasectomy    Colon cancer screening  Mother and MGM both had parts of colon removed for masses  Does not know dx  Mother -in 82 GM -in 5s    Bone health   Falls-none  Fractures-none  Supplements -mvi with D  Last vitamin D Lab Results  Component Value Date   VD25OH 38.77 04/09/2022    Exercise  Working outside  Walking     Mood    10/08/2023   10:06 AM 04/21/2022    3:39 PM 05/04/2020   10:12 AM 04/02/2018    2:46 PM  Depression screen PHQ 2/9  Decreased Interest 0 0 0 0  Down, Depressed, Hopeless 0 0 0 0  PHQ - 2 Score 0 0 0 0  Altered sleeping 0  0   Tired, decreased energy 0  0   Change in appetite 0  0   Feeling bad or failure about yourself  0  0    Trouble concentrating 0  0   Moving slowly or fidgety/restless 0  0   Suicidal thoughts 0  0   PHQ-9 Score 0  0   Difficult doing work/chores Not difficult at all      Acne-still using retin A - improved but not better  Very careful with the sun  Wants to increase strength  Cystic at times    Thyroid nodule Had felt a lump in her neck  Was imaged 10/2022 with US/ ordered by ENT Dr Andee Poles  Report   IMPRESSION:  Solitary nodule in the left thyroid lobe (1.0 cm TR 4) meets  criteria for follow-up ultrasound in 1 year. A total follow-up  interval of 5 years is recommended, with this exam serving as a  baseline.   Saw endocrinology- Dr Sharl Ma also for this  Did a bunch of labs for auto immune problems and inflammation  Worked up for sjogren's (negative)  Lost hair and felt bad  Has to go back yearly  Did take a course of prednisone  Possibly had a post viral thyroid inflammation   Will follow up yearly  Has history of NTHFR mutation with normal homocysteine     Patient Active Problem List   Diagnosis Date Noted   Thyroid nodule 10/08/2023   Family history of benign neoplasm of colon 10/08/2023   Encounter for hepatitis C screening test for low risk patient 10/08/2023   Acne vulgaris 04/21/2022   Raynaud's phenomenon without gangrene 01/17/2019   Vitamin D deficiency 04/02/2018   Routine general medical examination at a health care facility 07/17/2015   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   Past Medical History:  Diagnosis Date   Allergy    Frequent headaches    Past Surgical History:  Procedure Laterality Date   NASAL SINUS SURGERY  2007   turbinate reduction   TONSILLECTOMY AND ADENOIDECTOMY  2007   VAGINAL DELIVERY  2013   friable cervix with bleeding during pregnancy, anemia after pregnancy   WISDOM TOOTH EXTRACTION     Social History   Tobacco Use   Smoking status: Never   Smokeless tobacco: Never  Vaping Use   Vaping  status: Never Used  Substance Use Topics   Alcohol use: No   Drug use: No   Family History  Problem Relation Age of Onset   Heart disease Mother    Hyperlipidemia Mother    Hypertension Mother    Alcohol abuse Father    Drug abuse Father    Heart disease Father    Hypertension Father    Cancer Maternal Uncle 48       NHL   Arthritis Maternal Grandmother    Diabetes Maternal Grandmother    Arthritis Maternal Grandfather    Cancer Maternal Grandfather        Lung cancer   Heart disease Maternal Grandfather    Stroke Maternal Grandfather    Hypertension Maternal Grandfather    Allergies  Allergen Reactions   Vicodin [Hydrocodone-Acetaminophen] Nausea And Vomiting   Current Outpatient Medications on File Prior to Visit  Medication Sig Dispense Refill   cetirizine (ZYRTEC) 10 MG chewable tablet Chew 10 mg by mouth daily as needed for allergies.     Albuterol Sulfate (PROAIR RESPICLICK) 108 (90 Base) MCG/ACT AEPB Inhale 1 Dose into the lungs 3 (three) times daily as needed. (Patient not taking: Reported on 04/21/2022) 1 each 0   No current facility-administered medications on file prior to visit.    Review of Systems  Constitutional:  Negative for activity change, appetite change, fatigue, fever and unexpected weight change.  HENT:  Negative for congestion, ear pain, rhinorrhea, sinus pressure and sore throat.   Eyes:  Negative for pain, redness and visual disturbance.  Respiratory:  Negative for cough, shortness of breath and wheezing.   Cardiovascular:  Negative for chest pain and palpitations.  Gastrointestinal:  Negative for abdominal pain, blood in stool, constipation and diarrhea.  Endocrine: Negative for polydipsia and polyuria.  Genitourinary:  Negative for dysuria, frequency and urgency.       Gets vaginal dryness monthly right after menses   Musculoskeletal:  Negative for arthralgias, back pain and myalgias.  Skin:  Negative for pallor and rash.   Allergic/Immunologic: Negative for environmental allergies.  Neurological:  Negative for dizziness, syncope and headaches.  Hematological:  Negative for adenopathy. Does not bruise/bleed easily.  Psychiatric/Behavioral:  Negative for decreased concentration and dysphoric mood. The patient is not nervous/anxious.        Objective:   Physical Exam Constitutional:      General: She is not in acute distress.  Appearance: Normal appearance. She is well-developed and normal weight. She is not ill-appearing or diaphoretic.  HENT:     Head: Normocephalic and atraumatic.     Right Ear: Tympanic membrane, ear canal and external ear normal.     Left Ear: Tympanic membrane, ear canal and external ear normal.     Nose: Nose normal. No congestion.     Mouth/Throat:     Mouth: Mucous membranes are moist.     Pharynx: Oropharynx is clear. No posterior oropharyngeal erythema.  Eyes:     General: No scleral icterus.    Extraocular Movements: Extraocular movements intact.     Conjunctiva/sclera: Conjunctivae normal.     Pupils: Pupils are equal, round, and reactive to light.  Neck:     Thyroid: No thyromegaly.     Vascular: No carotid bruit or JVD.  Cardiovascular:     Rate and Rhythm: Normal rate and regular rhythm.     Pulses: Normal pulses.     Heart sounds: Normal heart sounds.     No gallop.  Pulmonary:     Effort: Pulmonary effort is normal. No respiratory distress.     Breath sounds: Normal breath sounds. No wheezing.     Comments: Good air exch Chest:     Chest wall: No tenderness.  Abdominal:     General: Bowel sounds are normal. There is no distension or abdominal bruit.     Palpations: Abdomen is soft. There is no mass.     Tenderness: There is no abdominal tenderness.     Hernia: No hernia is present.  Genitourinary:    Comments: Breast and pelvic exam are done by gyn provider   Musculoskeletal:        General: No tenderness. Normal range of motion.     Cervical back:  Normal range of motion and neck supple. No rigidity. No muscular tenderness.     Right lower leg: No edema.     Left lower leg: No edema.     Comments: No kyphosis   Lymphadenopathy:     Cervical: No cervical adenopathy.  Skin:    General: Skin is warm and dry.     Coloration: Skin is not pale.     Findings: No erythema or rash.     Comments: Solar lentigines diffusely  Several acne comedones on face One on forehead appears inflamed    Neurological:     Mental Status: She is alert. Mental status is at baseline.     Cranial Nerves: No cranial nerve deficit.     Motor: No abnormal muscle tone.     Coordination: Coordination normal.     Gait: Gait normal.     Deep Tendon Reflexes: Reflexes are normal and symmetric. Reflexes normal.  Psychiatric:        Mood and Affect: Mood normal.        Cognition and Memory: Cognition and memory normal.           Assessment & Plan:   Problem List Items Addressed This Visit       Endocrine   Thyroid nodule    Dx with [place] - ENT a year ago  Has seen endocrinology Dr Sharl Ma as well  No clinical changes  TSH and FT4 incl in labs today      Relevant Orders   TSH   T4, free     Musculoskeletal and Integument   Acne vulgaris    Improved with retin A 0.05% but not entirely  Still gets some cystic lesions esp on forehead   Will increase to 0.1% cream and update  Good habits Encouraged sun protection   If not improving in 3 mo consider dermatology  Declines OC or aldactone-wants to avoid oral or hormonal meds       Relevant Medications   tretinoin (RETIN-A) 0.1 % cream     Other   Vitamin D deficiency    D level today  Encouraged to add 1000 international units D3 daily in addn to mvi  Last level in 30s Discussed this for bone and overall health       Relevant Orders   VITAMIN D 25 Hydroxy (Vit-D Deficiency, Fractures)   Routine general medical examination at a health care facility - Primary    Reviewed health habits  including diet and exercise and skin cancer prevention Reviewed appropriate screening tests for age  Also reviewed health mt list, fam hx and immunization status , as well as social and family history   See HPI Labs reviewed and ordered Flu shot today  Labs today incl hep C screen  Due for gyn visit/will schedule and also sent for last pap report  Will start mammograms at 38 Discussed fall prevention, supplements and exercise for bone density   Encouraged to add strength training PHQ 0   Health Maintenance  Topic Date Due   Hepatitis C Screening  Never done   Pap with HPV screening  02/17/2020   COVID-19 Vaccine (2 - Pfizer risk series) 10/23/2025*   DTaP/Tdap/Td vaccine (4 - Td or Tdap) 04/21/2032   Flu Shot  Completed   HIV Screening  Completed   HPV Vaccine  Aged Out  *Topic was postponed. The date shown is not the original due date.         Relevant Orders   TSH   Lipid Panel   Comprehensive metabolic panel   CBC with Differential/Platelet   Family history of benign neoplasm of colon    Mother (in 17s) and MGM in 67s had lesions removed/partial colectomies Unsure what for or if malignant  ? Of something like a "carpet tumor"  Unsure if possible familial polyposis  Unable to get her mother's records in [place]   Pt is unsure if she needs early colonoscopy  No clinical changes   Will refer to GI to discuss       Relevant Orders   Ambulatory referral to Gastroenterology   Encounter for hepatitis C screening test for low risk patient    Hep C ab test today  Low risk  Declines other STD screening       Relevant Orders   Hepatitis C Antibody   Other Visit Diagnoses     Need for influenza vaccination       Relevant Orders   Flu vaccine trivalent PF, 6mos and older(Flulaval,Afluria,Fluarix,Fluzone) (Completed)

## 2023-10-08 ENCOUNTER — Encounter: Payer: Self-pay | Admitting: Family Medicine

## 2023-10-08 ENCOUNTER — Ambulatory Visit: Payer: Managed Care, Other (non HMO) | Admitting: Family Medicine

## 2023-10-08 VITALS — BP 104/62 | HR 72 | Temp 98.1°F | Ht 62.5 in | Wt 119.4 lb

## 2023-10-08 DIAGNOSIS — L700 Acne vulgaris: Secondary | ICD-10-CM

## 2023-10-08 DIAGNOSIS — Z8489 Family history of other specified conditions: Secondary | ICD-10-CM | POA: Insufficient documentation

## 2023-10-08 DIAGNOSIS — Z23 Encounter for immunization: Secondary | ICD-10-CM

## 2023-10-08 DIAGNOSIS — E041 Nontoxic single thyroid nodule: Secondary | ICD-10-CM | POA: Diagnosis not present

## 2023-10-08 DIAGNOSIS — Z0000 Encounter for general adult medical examination without abnormal findings: Secondary | ICD-10-CM

## 2023-10-08 DIAGNOSIS — Z1159 Encounter for screening for other viral diseases: Secondary | ICD-10-CM

## 2023-10-08 DIAGNOSIS — E559 Vitamin D deficiency, unspecified: Secondary | ICD-10-CM | POA: Diagnosis not present

## 2023-10-08 DIAGNOSIS — E7210 Disorders of sulfur-bearing amino-acid metabolism, unspecified: Secondary | ICD-10-CM

## 2023-10-08 LAB — CBC WITH DIFFERENTIAL/PLATELET
Basophils Absolute: 0 10*3/uL (ref 0.0–0.1)
Basophils Relative: 0.7 % (ref 0.0–3.0)
Eosinophils Absolute: 0.2 10*3/uL (ref 0.0–0.7)
Eosinophils Relative: 2.8 % (ref 0.0–5.0)
HCT: 40.6 % (ref 36.0–46.0)
Hemoglobin: 13.6 g/dL (ref 12.0–15.0)
Lymphocytes Relative: 27.2 % (ref 12.0–46.0)
Lymphs Abs: 1.6 10*3/uL (ref 0.7–4.0)
MCHC: 33.5 g/dL (ref 30.0–36.0)
MCV: 87.8 fL (ref 78.0–100.0)
Monocytes Absolute: 0.5 10*3/uL (ref 0.1–1.0)
Monocytes Relative: 8.4 % (ref 3.0–12.0)
Neutro Abs: 3.5 10*3/uL (ref 1.4–7.7)
Neutrophils Relative %: 60.9 % (ref 43.0–77.0)
Platelets: 310 10*3/uL (ref 150.0–400.0)
RBC: 4.63 Mil/uL (ref 3.87–5.11)
RDW: 12.7 % (ref 11.5–15.5)
WBC: 5.7 10*3/uL (ref 4.0–10.5)

## 2023-10-08 LAB — LIPID PANEL
Cholesterol: 154 mg/dL (ref 0–200)
HDL: 57.8 mg/dL (ref >39.00)
LDL Cholesterol: 85 mg/dL (ref 0–99)
NonHDL: 96.52
Total CHOL/HDL Ratio: 3
Triglycerides: 56 mg/dL (ref 0.0–149.0)
VLDL: 11.2 mg/dL (ref 0.0–40.0)

## 2023-10-08 LAB — T4, FREE: Free T4: 0.77 ng/dL (ref 0.60–1.60)

## 2023-10-08 LAB — COMPREHENSIVE METABOLIC PANEL
ALT: 13 U/L (ref 0–35)
AST: 11 U/L (ref 0–37)
Albumin: 4.5 g/dL (ref 3.5–5.2)
Alkaline Phosphatase: 44 U/L (ref 39–117)
BUN: 9 mg/dL (ref 6–23)
CO2: 26 meq/L (ref 19–32)
Calcium: 8.8 mg/dL (ref 8.4–10.5)
Chloride: 106 meq/L (ref 96–112)
Creatinine, Ser: 0.54 mg/dL (ref 0.40–1.20)
GFR: 115.77 mL/min (ref >60.00)
Glucose, Bld: 85 mg/dL (ref 70–99)
Potassium: 4.1 meq/L (ref 3.5–5.1)
Sodium: 139 meq/L (ref 135–145)
Total Bilirubin: 0.4 mg/dL (ref 0.2–1.2)
Total Protein: 6.7 g/dL (ref 6.0–8.3)

## 2023-10-08 LAB — TSH: TSH: 1.92 u[IU]/mL (ref 0.35–5.50)

## 2023-10-08 LAB — VITAMIN D 25 HYDROXY (VIT D DEFICIENCY, FRACTURES): VITD: 26.57 ng/mL — ABNORMAL LOW (ref 30.00–100.00)

## 2023-10-08 MED ORDER — TRETINOIN 0.1 % EX CREA: TOPICAL_CREAM | Freq: Every day | CUTANEOUS | 1 refills | Status: AC

## 2023-10-08 NOTE — Assessment & Plan Note (Addendum)
Mother (in 64s) and MGM in 78s had lesions removed/partial colectomies Unsure what for or if malignant  ? Of something like a "carpet tumor"  Unsure if possible familial polyposis  Unable to get her mother's records in [place]   Pt is unsure if she needs early colonoscopy  No clinical changes   Will refer to GI to discuss

## 2023-10-08 NOTE — Assessment & Plan Note (Signed)
Improved with retin A 0.05% but not entirely  Still gets some cystic lesions esp on forehead   Will increase to 0.1% cream and update  Good habits Encouraged sun protection   If not improving in 3 mo consider dermatology  Declines OC or aldactone-wants to avoid oral or hormonal meds

## 2023-10-08 NOTE — Patient Instructions (Addendum)
Get back with Dr Cherly Hensen and discuss vaginal symptoms   You will need to start mammograms after you turn 38   Flu shot today    Try the 0.1% retin A cream If not improved in 3 months we will need to think about dermatology referral   Continue the muti vitamin  You can add 1000 international units of vitamin D3 daily    Keep walking  Add some strength training to your routine, this is important for bone and brain health and can reduce your risk of falls and help your body use insulin properly and regulate weight  Light weights, exercise bands , and internet videos are a good way to start  Yoga (chair or regular), machines , floor exercises or a gym with machines are also good options    Labs today   Take good care of yourself  I placed a referral to GI  Due to there being a large influx of referrals the GI offices are currently scheduling appointments as far out as late Aug/Sept 2022. They have asked that you call their office regarding your referral and scheduling needs. Otherwise, they will reach out to you as soon as they are able. They are working diligently to get patients called and scheduled as soon as possible.  Colonoscopies will be called according to date/time of the referral entry as these are not of urgent need.   You can call for an appointment  [facility]  [phone]

## 2023-10-08 NOTE — Assessment & Plan Note (Addendum)
Hep C ab test today  Low risk  Declines other STD screening

## 2023-10-08 NOTE — Assessment & Plan Note (Signed)
No clinical changes  Has been seen at [facility]

## 2023-10-08 NOTE — Assessment & Plan Note (Signed)
Reviewed health habits including diet and exercise and skin cancer prevention Reviewed appropriate screening tests for age  Also reviewed health mt list, fam hx and immunization status , as well as social and family history   See HPI Labs reviewed and ordered Flu shot today  Labs today incl hep C screen  Due for gyn visit/will schedule and also sent for last pap report  Will start mammograms at 38 Discussed fall prevention, supplements and exercise for bone density   Encouraged to add strength training PHQ 0   Health Maintenance  Topic Date Due   Hepatitis C Screening  Never done   Pap with HPV screening  02/17/2020   COVID-19 Vaccine (2 - Pfizer risk series) 10/23/2025*   DTaP/Tdap/Td vaccine (4 - Td or Tdap) 04/21/2032   Flu Shot  Completed   HIV Screening  Completed   HPV Vaccine  Aged Out  *Topic was postponed. The date shown is not the original due date.

## 2023-10-08 NOTE — Assessment & Plan Note (Signed)
D level today  Encouraged to add 1000 international units D3 daily in addn to mvi  Last level in 30s Discussed this for bone and overall health

## 2023-10-08 NOTE — Assessment & Plan Note (Signed)
Dx with [place] - ENT a year ago  Has seen endocrinology Dr Sharl Ma as well  No clinical changes  TSH and FT4 incl in labs today

## 2023-10-09 LAB — HEPATITIS C ANTIBODY: Hepatitis C Ab: NONREACTIVE

## 2024-02-18 ENCOUNTER — Encounter: Payer: Self-pay | Admitting: Family Medicine

## 2024-02-18 DIAGNOSIS — Z111 Encounter for screening for respiratory tuberculosis: Secondary | ICD-10-CM

## 2024-02-18 DIAGNOSIS — Z021 Encounter for pre-employment examination: Secondary | ICD-10-CM

## 2024-02-21 DIAGNOSIS — Z111 Encounter for screening for respiratory tuberculosis: Secondary | ICD-10-CM | POA: Insufficient documentation

## 2024-02-21 DIAGNOSIS — Z021 Encounter for pre-employment examination: Secondary | ICD-10-CM | POA: Insufficient documentation

## 2024-02-21 NOTE — Telephone Encounter (Signed)
 Do these labs look correct, future / [facility] clinic collect? Thanks

## 2024-02-22 ENCOUNTER — Other Ambulatory Visit: Payer: Self-pay | Admitting: Family Medicine

## 2024-02-22 DIAGNOSIS — Z021 Encounter for pre-employment examination: Secondary | ICD-10-CM

## 2024-02-22 NOTE — Telephone Encounter (Signed)
 [facility] called because pt was there with no orders, orders released and faxed to the [facility] she was at

## 2024-02-22 NOTE — Addendum Note (Signed)
 Addended by: Shon Millet on: 02/22/2024 11:37 AM   Modules accepted: Orders

## 2024-02-22 NOTE — Telephone Encounter (Signed)
 12 panel drug screen with everything

## 2024-02-24 ENCOUNTER — Encounter: Payer: Self-pay | Admitting: Family Medicine

## 2024-02-24 LAB — DRUG PROFILE, UR, 9 DRUGS (LABCORP)
Amphetamines, Urine: NEGATIVE ng/mL
Barbiturate Quant, Ur: NEGATIVE ng/mL
Benzodiazepine Quant, Ur: NEGATIVE ng/mL
Cannabinoid Quant, Ur: NEGATIVE ng/mL
Cocaine (Metab.): NEGATIVE ng/mL
Creatinine, Urine: 64.4 mg/dL (ref 20.0–300.0)
Methadone Screen, Urine: NEGATIVE ng/mL
Nitrite Urine, Quantitative: NEGATIVE ug/mL
OPIATE SCREEN URINE: NEGATIVE ng/mL
PCP Quant, Ur: NEGATIVE ng/mL
Propoxyphene: NEGATIVE ng/mL
pH, Urine: 5.7 (ref 4.5–8.9)

## 2024-02-25 LAB — QUANTIFERON-TB GOLD PLUS
QuantiFERON Mitogen Value: 3.49 [IU]/mL
QuantiFERON Nil Value: 0.03 [IU]/mL
QuantiFERON TB1 Ag Value: 0.04 [IU]/mL
QuantiFERON TB2 Ag Value: 0.03 [IU]/mL
QuantiFERON-TB Gold Plus: NEGATIVE

## 2024-04-07 ENCOUNTER — Other Ambulatory Visit: Payer: Self-pay | Admitting: Internal Medicine

## 2024-04-07 DIAGNOSIS — E041 Nontoxic single thyroid nodule: Secondary | ICD-10-CM

## 2024-04-08 ENCOUNTER — Ambulatory Visit
Admission: RE | Admit: 2024-04-08 | Discharge: 2024-04-08 | Disposition: A | Discharge status: Home / Self Care | Source: Ambulatory Visit | Attending: Internal Medicine | Admitting: Internal Medicine

## 2024-04-08 DIAGNOSIS — E041 Nontoxic single thyroid nodule: Secondary | ICD-10-CM

## 2024-07-04 ENCOUNTER — Other Ambulatory Visit (HOSPITAL_BASED_OUTPATIENT_CLINIC_OR_DEPARTMENT_OTHER): Payer: Self-pay

## 2024-07-04 MED ORDER — NITROFURANTOIN MONOHYD MACRO 100 MG PO CAPS
100.0000 mg | ORAL_CAPSULE | Freq: Two times a day (BID) | ORAL | 0 refills | Status: AC
  Filled 2024-07-04: qty 10, 5d supply, fill #0
